# Patient Record
Sex: Female | Born: 1970
Health system: Southern US, Community
[De-identification: ages and names within clinical notes are randomized; demographics above are authoritative.]

## PROBLEM LIST (undated history)

## (undated) HISTORY — PX: NO PAST SURGERIES: SHX2092

---

## 2003-12-06 ENCOUNTER — Other Ambulatory Visit: Payer: Self-pay

## 2004-07-15 ENCOUNTER — Emergency Department: Payer: Self-pay | Admitting: Emergency Medicine

## 2004-07-17 ENCOUNTER — Ambulatory Visit: Payer: Self-pay | Admitting: Emergency Medicine

## 2005-02-25 ENCOUNTER — Observation Stay: Payer: Self-pay | Admitting: Unknown Physician Specialty

## 2005-03-07 ENCOUNTER — Inpatient Hospital Stay: Payer: Self-pay

## 2006-09-11 ENCOUNTER — Ambulatory Visit: Payer: Self-pay | Admitting: Internal Medicine

## 2007-08-13 ENCOUNTER — Ambulatory Visit: Payer: Self-pay | Admitting: Internal Medicine

## 2008-08-25 ENCOUNTER — Ambulatory Visit: Payer: Self-pay | Admitting: Internal Medicine

## 2017-10-16 DIAGNOSIS — L708 Other acne: Secondary | ICD-10-CM | POA: Diagnosis not present

## 2017-10-31 DIAGNOSIS — Z7689 Persons encountering health services in other specified circumstances: Secondary | ICD-10-CM | POA: Diagnosis not present

## 2017-11-05 DIAGNOSIS — Z7689 Persons encountering health services in other specified circumstances: Secondary | ICD-10-CM | POA: Diagnosis not present

## 2017-11-12 DIAGNOSIS — Z0000 Encounter for general adult medical examination without abnormal findings: Secondary | ICD-10-CM | POA: Diagnosis not present

## 2017-11-12 DIAGNOSIS — Z01419 Encounter for gynecological examination (general) (routine) without abnormal findings: Secondary | ICD-10-CM | POA: Diagnosis not present

## 2017-12-24 DIAGNOSIS — J019 Acute sinusitis, unspecified: Secondary | ICD-10-CM | POA: Diagnosis not present

## 2017-12-30 ENCOUNTER — Other Ambulatory Visit: Payer: Self-pay | Admitting: Internal Medicine

## 2017-12-30 DIAGNOSIS — Z1231 Encounter for screening mammogram for malignant neoplasm of breast: Secondary | ICD-10-CM

## 2018-01-14 ENCOUNTER — Ambulatory Visit
Admission: RE | Admit: 2018-01-14 | Discharge: 2018-01-14 | Disposition: A | Discharge status: Home / Self Care | Payer: 59 | Source: Ambulatory Visit | Attending: Internal Medicine | Admitting: Internal Medicine

## 2018-01-14 ENCOUNTER — Encounter: Payer: Self-pay | Admitting: Radiology

## 2018-01-14 DIAGNOSIS — Z1231 Encounter for screening mammogram for malignant neoplasm of breast: Secondary | ICD-10-CM | POA: Insufficient documentation

## 2018-01-20 ENCOUNTER — Inpatient Hospital Stay
Admission: RE | Admit: 2018-01-20 | Discharge: 2018-01-20 | Disposition: A | Discharge status: Home / Self Care | Payer: Self-pay | Source: Ambulatory Visit | Attending: *Deleted | Admitting: *Deleted

## 2018-01-20 ENCOUNTER — Other Ambulatory Visit: Payer: Self-pay | Admitting: *Deleted

## 2018-01-20 DIAGNOSIS — Z9289 Personal history of other medical treatment: Secondary | ICD-10-CM

## 2018-05-06 DIAGNOSIS — R946 Abnormal results of thyroid function studies: Secondary | ICD-10-CM | POA: Diagnosis not present

## 2018-05-06 DIAGNOSIS — R5383 Other fatigue: Secondary | ICD-10-CM | POA: Diagnosis not present

## 2018-05-06 DIAGNOSIS — E785 Hyperlipidemia, unspecified: Secondary | ICD-10-CM | POA: Diagnosis not present

## 2018-05-15 DIAGNOSIS — E785 Hyperlipidemia, unspecified: Secondary | ICD-10-CM | POA: Diagnosis not present

## 2018-06-11 DIAGNOSIS — L670 Trichorrhexis nodosa: Secondary | ICD-10-CM | POA: Diagnosis not present

## 2018-07-24 DIAGNOSIS — D649 Anemia, unspecified: Secondary | ICD-10-CM | POA: Diagnosis not present

## 2018-08-08 ENCOUNTER — Other Ambulatory Visit: Payer: Self-pay | Admitting: Internal Medicine

## 2018-08-12 ENCOUNTER — Other Ambulatory Visit: Payer: Self-pay | Admitting: Internal Medicine

## 2018-08-12 DIAGNOSIS — Z1231 Encounter for screening mammogram for malignant neoplasm of breast: Secondary | ICD-10-CM

## 2018-08-13 ENCOUNTER — Other Ambulatory Visit: Payer: Self-pay | Admitting: Internal Medicine

## 2018-08-13 DIAGNOSIS — N644 Mastodynia: Secondary | ICD-10-CM

## 2018-08-15 ENCOUNTER — Other Ambulatory Visit: Payer: Self-pay | Admitting: Internal Medicine

## 2018-08-15 DIAGNOSIS — N644 Mastodynia: Secondary | ICD-10-CM

## 2018-08-19 ENCOUNTER — Other Ambulatory Visit: Payer: 59

## 2018-09-10 ENCOUNTER — Inpatient Hospital Stay: Admission: RE | Admit: 2018-09-10 | Payer: 59 | Source: Ambulatory Visit

## 2018-09-10 ENCOUNTER — Other Ambulatory Visit: Payer: 59

## 2018-10-05 DIAGNOSIS — A084 Viral intestinal infection, unspecified: Secondary | ICD-10-CM | POA: Diagnosis not present

## 2018-11-05 ENCOUNTER — Ambulatory Visit
Admission: RE | Admit: 2018-11-05 | Discharge: 2018-11-05 | Disposition: A | Discharge status: Home / Self Care | Payer: 59 | Source: Ambulatory Visit | Attending: Internal Medicine | Admitting: Internal Medicine

## 2018-11-05 ENCOUNTER — Other Ambulatory Visit: Payer: Self-pay

## 2018-11-05 DIAGNOSIS — N644 Mastodynia: Secondary | ICD-10-CM

## 2018-11-06 ENCOUNTER — Other Ambulatory Visit: Payer: 59

## 2018-11-11 DIAGNOSIS — Z0000 Encounter for general adult medical examination without abnormal findings: Secondary | ICD-10-CM | POA: Diagnosis not present

## 2018-11-18 DIAGNOSIS — Z0000 Encounter for general adult medical examination without abnormal findings: Secondary | ICD-10-CM | POA: Diagnosis not present

## 2018-11-18 DIAGNOSIS — E785 Hyperlipidemia, unspecified: Secondary | ICD-10-CM | POA: Diagnosis not present

## 2019-01-14 IMAGING — MG MM DIGITAL SCREENING BILAT W/ CAD
4 series · 4 of 4 positions shown · non-contrast
Comparison: Previous exam(s).

CLINICAL DATA: Screening.

EXAM:
DIGITAL SCREENING BILATERAL MAMMOGRAM WITH CAD

[L CC]
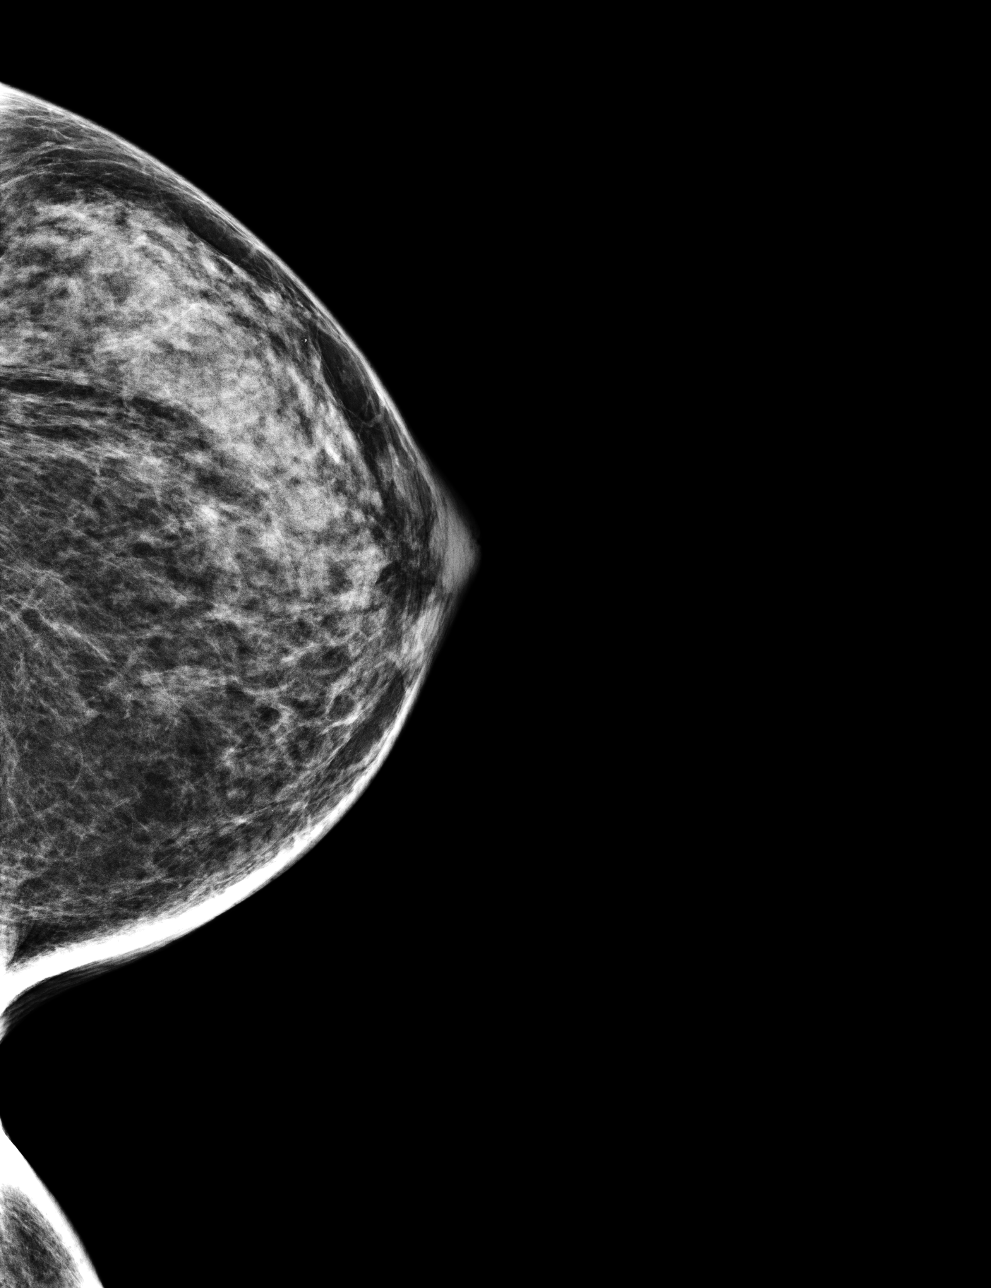

[R MLO]
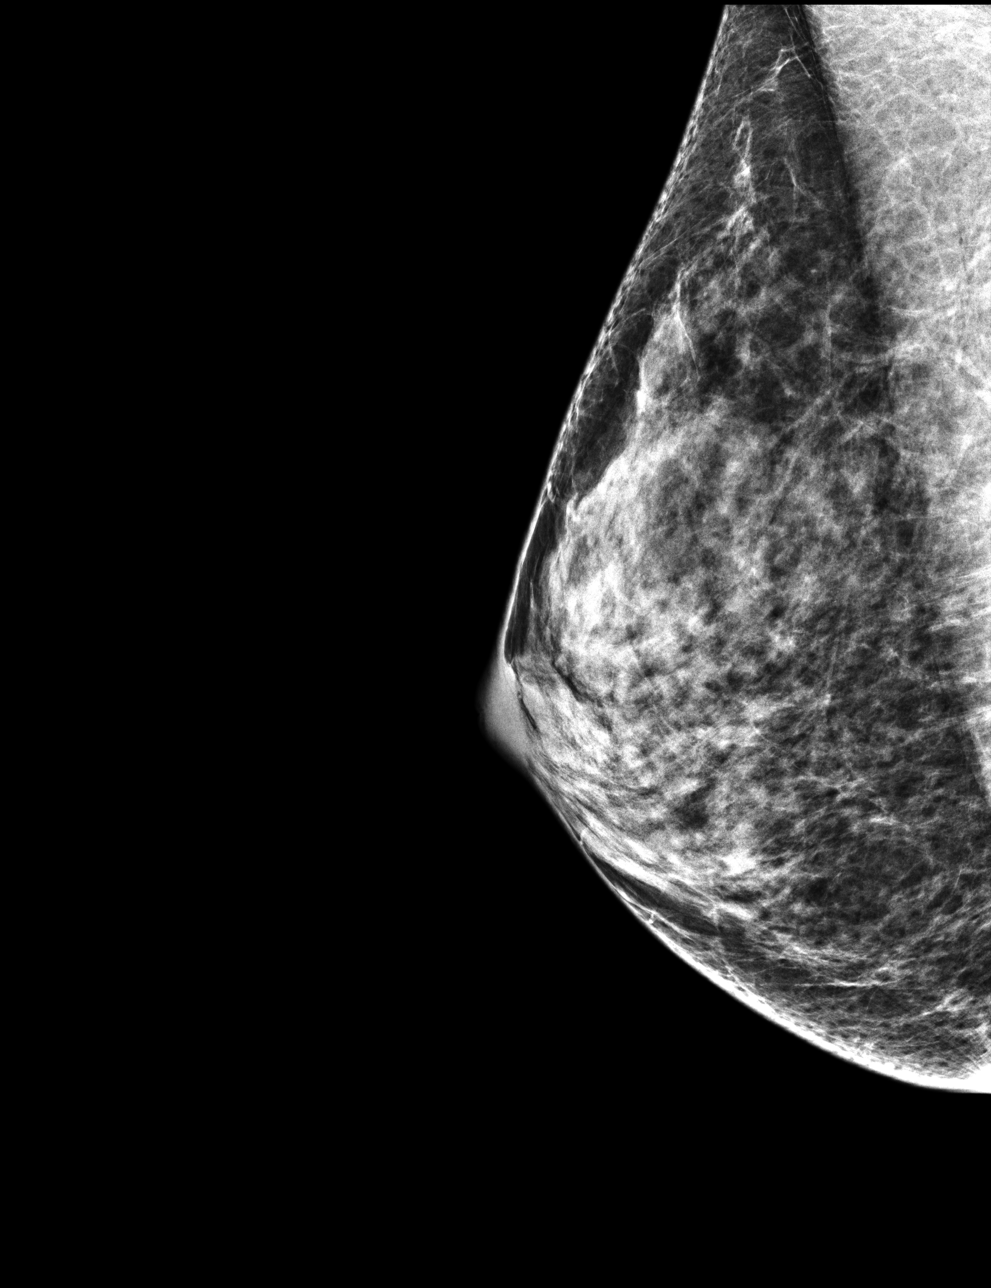

[L MLO]
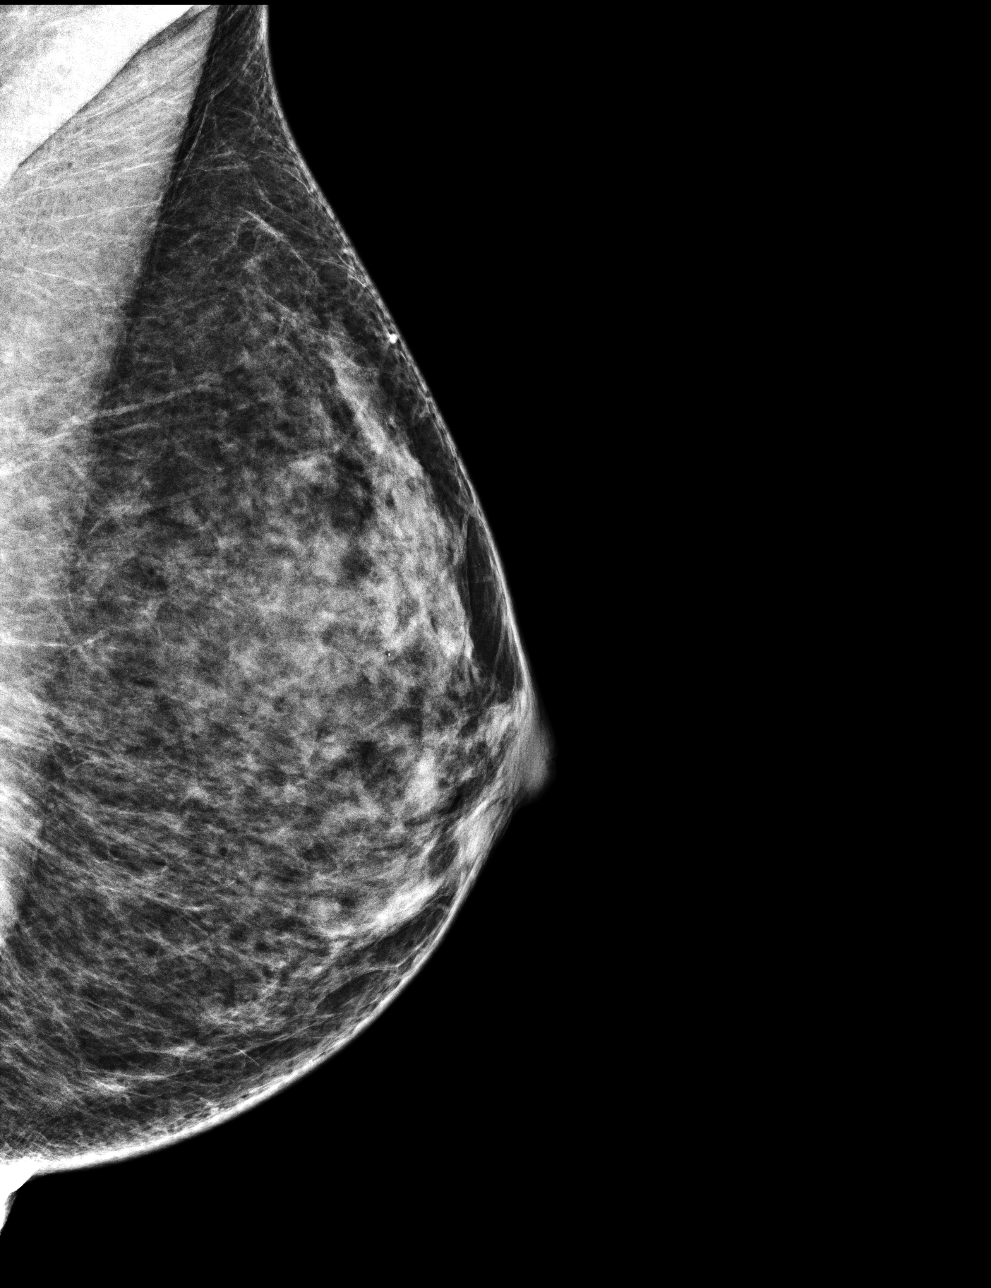

[R CC]
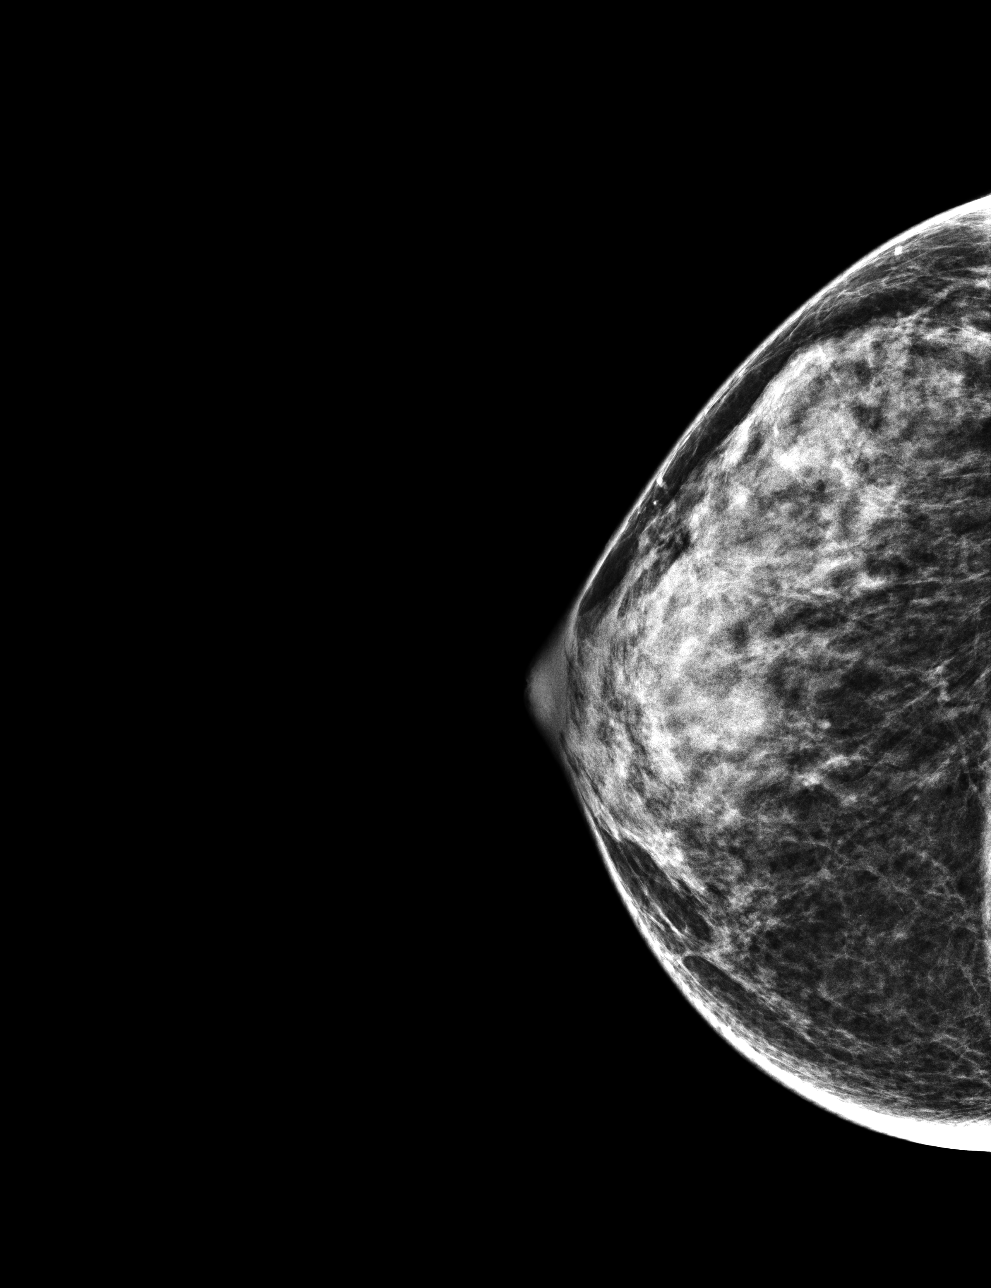

[4 of 4 positions shown; findings below may reference images not displayed]

ACR Breast Density Category c: The breast tissue is heterogeneously
dense, which may obscure small masses.
FINDINGS: There are no findings suspicious for malignancy. Images were
processed with CAD.
IMPRESSION: No mammographic evidence of malignancy. A result letter of this
screening mammogram will be mailed directly to the patient.

RECOMMENDATION:
Screening mammogram in one year. (Code:[id number])

BI-RADS CATEGORY  1: Negative.

## 2019-03-02 ENCOUNTER — Other Ambulatory Visit: Payer: Self-pay | Admitting: Internal Medicine

## 2019-03-02 DIAGNOSIS — Z1231 Encounter for screening mammogram for malignant neoplasm of breast: Secondary | ICD-10-CM

## 2019-03-17 ENCOUNTER — Ambulatory Visit
Admission: RE | Admit: 2019-03-17 | Discharge: 2019-03-17 | Disposition: A | Discharge status: Home / Self Care | Payer: 59 | Source: Ambulatory Visit | Attending: Internal Medicine | Admitting: Internal Medicine

## 2019-03-17 ENCOUNTER — Encounter (INDEPENDENT_AMBULATORY_CARE_PROVIDER_SITE_OTHER): Payer: Self-pay

## 2019-03-17 ENCOUNTER — Other Ambulatory Visit: Payer: Self-pay

## 2019-03-17 DIAGNOSIS — Z1231 Encounter for screening mammogram for malignant neoplasm of breast: Secondary | ICD-10-CM | POA: Diagnosis not present

## 2020-06-03 ENCOUNTER — Other Ambulatory Visit: Payer: Self-pay | Admitting: Internal Medicine

## 2020-06-03 DIAGNOSIS — Z1231 Encounter for screening mammogram for malignant neoplasm of breast: Secondary | ICD-10-CM

## 2020-09-30 ENCOUNTER — Other Ambulatory Visit: Payer: Self-pay

## 2020-09-30 ENCOUNTER — Ambulatory Visit
Admission: RE | Admit: 2020-09-30 | Discharge: 2020-09-30 | Disposition: A | Discharge status: Home / Self Care | Payer: BC Managed Care – PPO | Source: Ambulatory Visit | Attending: Internal Medicine | Admitting: Internal Medicine

## 2020-09-30 DIAGNOSIS — Z1231 Encounter for screening mammogram for malignant neoplasm of breast: Secondary | ICD-10-CM | POA: Diagnosis not present

## 2020-09-30 LAB — HM MAMMOGRAPHY

## 2021-05-03 DIAGNOSIS — Z0000 Encounter for general adult medical examination without abnormal findings: Secondary | ICD-10-CM | POA: Diagnosis not present

## 2021-05-03 DIAGNOSIS — E785 Hyperlipidemia, unspecified: Secondary | ICD-10-CM | POA: Diagnosis not present

## 2021-05-04 DIAGNOSIS — M5489 Other dorsalgia: Secondary | ICD-10-CM | POA: Diagnosis not present

## 2021-05-18 DIAGNOSIS — Z0000 Encounter for general adult medical examination without abnormal findings: Secondary | ICD-10-CM | POA: Diagnosis not present

## 2021-05-18 DIAGNOSIS — F419 Anxiety disorder, unspecified: Secondary | ICD-10-CM | POA: Diagnosis not present

## 2021-05-18 DIAGNOSIS — M7918 Myalgia, other site: Secondary | ICD-10-CM | POA: Diagnosis not present

## 2021-06-04 DIAGNOSIS — Z1211 Encounter for screening for malignant neoplasm of colon: Secondary | ICD-10-CM | POA: Diagnosis not present

## 2021-06-04 DIAGNOSIS — Z1212 Encounter for screening for malignant neoplasm of rectum: Secondary | ICD-10-CM | POA: Diagnosis not present

## 2021-06-04 LAB — COLOGUARD: Cologuard: NEGATIVE

## 2021-06-17 LAB — COLOGUARD: COLOGUARD: NEGATIVE

## 2021-07-11 ENCOUNTER — Ambulatory Visit: Payer: Self-pay | Admitting: Internal Medicine

## 2021-10-05 ENCOUNTER — Encounter: Payer: Self-pay | Admitting: Internal Medicine

## 2021-10-05 ENCOUNTER — Ambulatory Visit (INDEPENDENT_AMBULATORY_CARE_PROVIDER_SITE_OTHER): Payer: BC Managed Care – PPO | Admitting: Internal Medicine

## 2021-10-05 ENCOUNTER — Other Ambulatory Visit: Payer: Self-pay

## 2021-10-05 DIAGNOSIS — M542 Cervicalgia: Secondary | ICD-10-CM | POA: Insufficient documentation

## 2021-10-05 DIAGNOSIS — F411 Generalized anxiety disorder: Secondary | ICD-10-CM

## 2021-10-05 DIAGNOSIS — F419 Anxiety disorder, unspecified: Secondary | ICD-10-CM | POA: Insufficient documentation

## 2021-10-05 NOTE — Patient Instructions (Signed)

## 2021-10-05 NOTE — Assessment & Plan Note (Signed)
Stable on Paroxetine ?Support offered ?Will monitor ?

## 2021-10-05 NOTE — Assessment & Plan Note (Signed)
Intermittent ?Continue Meloxicam as needed ?

## 2021-10-05 NOTE — Progress Notes (Signed)
HPI ? ?Patient presents to clinic today to establish care and for management of the conditions listed below. ? ?Anxiety: Chronic, managed on Paroxetine.  She is not currently seeing a therapist.  She denies depression, SI/HI. ? ?Intermittent Neck Pain: managed with Meloxicam as needed. ? ?No past medical history on file. ? ?Current Outpatient Medications  ?Medication Sig Dispense Refill  ? fluticasone (FLONASE) 50 MCG/ACT nasal spray 2 sprays in each nostril    ? meloxicam (MOBIC) 15 MG tablet Take by mouth.    ? PARoxetine (PAXIL) 20 MG tablet Take 20 mg by mouth every morning.    ? ?No current facility-administered medications for this visit.  ? ? ?No Known Allergies ? ?Family History  ?Problem Relation Age of Onset  ? Lung cancer Mother   ? Diabetes Father   ? Hypertension Father   ? Healthy Sister   ? Breast cancer Maternal Grandmother 12  ? Congestive Heart Failure Paternal Grandmother   ? Colon cancer Neg Hx   ? ? ?Social History  ? ?Socioeconomic History  ? Marital status: Single  ?  Spouse name: Not on file  ? Number of children: Not on file  ? Years of education: Not on file  ? Highest education level: Not on file  ?Occupational History  ? Not on file  ?Tobacco Use  ? Smoking status: Never  ? Smokeless tobacco: Never  ?Vaping Use  ? Vaping Use: Never used  ?Substance and Sexual Activity  ? Alcohol use: Not on file  ? Drug use: Not on file  ? Sexual activity: Not on file  ?Other Topics Concern  ? Not on file  ?Social History Narrative  ? Not on file  ? ?Social Determinants of Health  ? ?Financial Resource Strain: Not on file  ?Food Insecurity: Not on file  ?Transportation Needs: Not on file  ?Physical Activity: Not on file  ?Stress: Not on file  ?Social Connections: Not on file  ?Intimate Partner Violence: Not on file  ? ? ?ROS: ? ?Constitutional: Denies fever, malaise, fatigue, headache or abrupt weight changes.  ?HEENT: Denies eye pain, eye redness, ear pain, ringing in the ears, wax buildup, runny nose,  nasal congestion, bloody nose, or sore throat. ?Respiratory: Denies difficulty breathing, shortness of breath, cough or sputum production.   ?Cardiovascular: Denies chest pain, chest tightness, palpitations or swelling in the hands or feet.  ?Gastrointestinal: Denies abdominal pain, bloating, constipation, diarrhea or blood in the stool.  ?GU: Denies frequency, urgency, pain with urination, blood in urine, odor or discharge. ?Musculoskeletal: Patient reports intermittent neck pain.  Denies decrease in range of motion, difficulty with gait, or joint swelling.  ?Skin: Denies redness, rashes, lesions or ulcercations.  ?Neurological: Denies dizziness, difficulty with memory, difficulty with speech or problems with balance and coordination.  ?Psych: Patient has a history of anxiety.  Denies depression, SI/HI. ? ?No other specific complaints in a complete review of systems (except as listed in HPI above). ? ?PE: ? ?BP 136/60 (BP Location: Right Arm, Patient Position: Sitting, Cuff Size: Normal)   Pulse (!) 55   Temp (!) 97.3 ?F (36.3 ?C) (Temporal)   Ht 5' 5.5" (1.664 m)   Wt 138 lb (62.6 kg)   SpO2 100%   BMI 22.62 kg/m?  ?Wt Readings from Last 3 Encounters:  ?10/05/21 138 lb (62.6 kg)  ? ? ?General: Appears her stated age, well developed, well nourished in NAD. ?HEENT: Head: normal shape and size; Eyes: sclera white, PERRLA and EOMs intact;  ?  Cardiovascular: Bradycardic with normal rhythm. S1,S2 noted.  No murmur, rubs or gallops noted. No JVD or BLE edema.  ?Pulmonary/Chest: Normal effort and positive vesicular breath sounds. No respiratory distress. No wheezes, rales or ronchi noted.  ?Musculoskeletal: No difficulty with gait.  ?Neurological: Alert and oriented. ?Psychiatric: Mood and affect normal. Behavior is normal. Judgment and thought content normal.  ? ? ? ?Assessment and Plan: ? ?Webb Silversmith, NP ?This visit occurred during the SARS-CoV-2 public health emergency.  Safety protocols were in place, including  screening questions prior to the visit, additional usage of staff PPE, and extensive cleaning of exam room while observing appropriate contact time as indicated for disinfecting solutions.  ? ?

## 2021-11-14 ENCOUNTER — Other Ambulatory Visit: Payer: Self-pay | Admitting: Internal Medicine

## 2021-11-14 DIAGNOSIS — Z1231 Encounter for screening mammogram for malignant neoplasm of breast: Secondary | ICD-10-CM

## 2021-12-18 ENCOUNTER — Ambulatory Visit
Admission: RE | Admit: 2021-12-18 | Discharge: 2021-12-18 | Disposition: A | Discharge status: Home / Self Care | Payer: BC Managed Care – PPO | Source: Ambulatory Visit | Attending: Internal Medicine | Admitting: Internal Medicine

## 2021-12-18 ENCOUNTER — Encounter: Payer: Self-pay | Admitting: Radiology

## 2021-12-18 DIAGNOSIS — Z1231 Encounter for screening mammogram for malignant neoplasm of breast: Secondary | ICD-10-CM | POA: Insufficient documentation

## 2022-04-13 ENCOUNTER — Ambulatory Visit (INDEPENDENT_AMBULATORY_CARE_PROVIDER_SITE_OTHER): Payer: BC Managed Care – PPO | Admitting: Internal Medicine

## 2022-04-13 ENCOUNTER — Encounter: Payer: Self-pay | Admitting: Internal Medicine

## 2022-04-13 VITALS — BP 112/72 | HR 68 | Temp 97.3°F | Ht 66.0 in | Wt 134.0 lb

## 2022-04-13 DIAGNOSIS — G8929 Other chronic pain: Secondary | ICD-10-CM | POA: Insufficient documentation

## 2022-04-13 DIAGNOSIS — Z1159 Encounter for screening for other viral diseases: Secondary | ICD-10-CM | POA: Diagnosis not present

## 2022-04-13 DIAGNOSIS — Z0001 Encounter for general adult medical examination with abnormal findings: Secondary | ICD-10-CM | POA: Diagnosis not present

## 2022-04-13 DIAGNOSIS — Z114 Encounter for screening for human immunodeficiency virus [HIV]: Secondary | ICD-10-CM | POA: Diagnosis not present

## 2022-04-13 MED ORDER — PAROXETINE HCL 20 MG PO TABS: 20.0000 mg | ORAL_TABLET | Freq: Every morning | ORAL | 3 refills | Status: DC

## 2022-04-13 MED ORDER — MELOXICAM 15 MG PO TABS: 15.0000 mg | ORAL_TABLET | Freq: Every day | ORAL | 3 refills | Status: AC

## 2022-04-13 NOTE — Patient Instructions (Signed)

## 2022-04-13 NOTE — Progress Notes (Signed)
Subjective:    Patient ID: Donna Rocha, female    DOB: [birth year], 51 y.o.   MRN: [id number]  HPI  Patient presents to clinic today for her annual exam.  Flu: never Tetanus: unsure, at [employer] COVID: never Shingrix: never Pap smear: < 5 years ago, [facility] M[facility]ogram: 11/2021 Colon screening: Cologuard Vision screening: annually Dentist: biannually  Diet: She does eat meat. She consumes fruits and veggies. She does eat some fried foods. She drinks mostly juice, sweet tea. Exercise: None  Review of Systems     No past medical history on file.  Current Outpatient Medications  Medication Sig Dispense Refill   fluticasone (FLONASE) 50 MCG/ACT nasal spray 2 sprays in each nostril     meloxicam (MOBIC) 15 MG tablet Take by mouth.     PARoxetine (PAXIL) 20 MG tablet Take 20 mg by mouth every morning.     No current facility-administered medications for this visit.    No Known Allergies  Family History  Problem Relation Age of Onset   Lung cancer Mother    Diabetes Father    Hypertension Father    Healthy Sister    Breast cancer Maternal Grandmother 76   Congestive Heart Failure Paternal Grandmother    Colon cancer Neg Hx     Social History   Socioeconomic History   Marital status: Single    Spouse name: Not on file   Number of children: Not on file   Years of education: Not on file   Highest education level: Not on file  Occupational History   Not on file  Tobacco Use   Smoking status: Never   Smokeless tobacco: Never  Vaping Use   Vaping Use: Never used  Substance and Sexual Activity   Alcohol use: Not on file   Drug use: Not on file   Sexual activity: Not on file  Other Topics Concern   Not on file  Social History Narrative   Not on file   Social Determinants of Health   Financial Resource Strain: Not on file  Food Insecurity: Not on file  Transportation Needs: Not on file  Physical Activity: Not on file  Stress: Not on  file  Social Connections: Not on file  Intimate Partner Violence: Not on file     Constitutional: Denies fever, malaise, fatigue, headache or abrupt weight changes.  HEENT: Denies eye pain, eye redness, ear pain, ringing in the ears, wax buildup, runny nose, nasal congestion, bloody nose, or sore throat. Respiratory: Denies difficulty breathing, shortness of breath, cough or sputum production.   Cardiovascular: Denies chest pain, chest tightness, palpitations or swelling in the hands or feet.  Gastrointestinal: Denies abdominal pain, bloating, constipation, diarrhea or blood in the stool.  GU: Pt reports irregular periods. Denies urgency, frequency, pain with urination, burning sensation, blood in urine, odor or discharge. Musculoskeletal: Pt reports intermittent left shoulder pain. Denies decrease in range of motion, difficulty with gait, muscle pain or joint pain and swelling.  Skin: Denies redness, rashes, lesions or ulcercations.  Neurological: Denies dizziness, difficulty with memory, difficulty with speech or problems with balance and coordination.  Psych: Patient has a history of anxiety.  Denies depression, SI/HI.  No other specific complaints in a complete review of systems (except as listed in HPI above).  Objective:   Physical Exam BP 112/72 (BP Location: Left Arm, Patient Position: Sitting, Cuff Size: Normal)   Pulse 68   Temp (!) 97.3 F (36.3 C) (Temporal)  Ht '5\' 6"'  (1.676 m)   Wt 134 lb (60.8 kg)   SpO2 99%   BMI 21.63 kg/m   Wt Readings from Last 3 Encounters:  10/05/21 138 lb (62.6 kg)    General: Appears her stated age, well developed, well nourished in NAD. Skin: Warm, dry and intact.  HEENT: Head: normal shape and size; Eyes: sclera white, no icterus, conjunctiva pink, PERRLA and EOMs intact;  Neck:  Neck supple, trachea midline. No masses, lumps or thyromegaly present.  Cardiovascular: Normal rate and rhythm. S1,S2 noted.  No murmur, rubs or gallops noted.  No JVD or BLE edema. No carotid bruits noted. Pulmonary/Chest: Normal effort and positive vesicular breath sounds. No respiratory distress. No wheezes, rales or ronchi noted.  Abdomen: Normal bowel sounds Musculoskeletal: Strength 5/5 BUE/BLE. No difficulty with gait.  Neurological: Alert and oriented. Cranial nerves II-XII grossly intact. Coordination normal.  Psychiatric: Mood and affect normal. Behavior is normal. Judgment and thought content normal.       Assessment & Plan:   Preventative Health Maintenance:  She declines flu shot today Tetanus UTD per her report Encouraged her to get her COVID-vaccine Discussed Shingrix vaccine, she will check coverage with her insurance company and schedule a nurse visit if she would like to have this done Pap smear UTD per her report, will request copy Mammogram UTD Cologuard UTD per her report, will request copy Encouraged her to consume a balanced diet and exercise regimen Advised her to see an eye doctor and dentist annually We will check CBC, c-Met, lipid, A1c, HIV and hep C today  RTC in 1 year, sooner if needed Webb Silversmith, NP

## 2022-04-17 LAB — COMPLETE METABOLIC PANEL WITH GFR
AG Ratio: 1.7 (calc) (ref 1.0–2.5)
ALT: 10 U/L (ref 6–29)
AST: 13 U/L (ref 10–35)
Albumin: 4.5 g/dL (ref 3.6–5.1)
Alkaline phosphatase (APISO): 40 U/L (ref 37–153)
BUN: 18 mg/dL (ref 7–25)
CO2: 25 mmol/L (ref 20–32)
Calcium: 9.9 mg/dL (ref 8.6–10.4)
Chloride: 106 mmol/L (ref 98–110)
Creat: 0.76 mg/dL (ref 0.50–1.03)
Globulin: 2.6 g/dL (calc) (ref 1.9–3.7)
Glucose, Bld: 63 mg/dL — ABNORMAL LOW (ref 65–99)
Potassium: 3.8 mmol/L (ref 3.5–5.3)
Sodium: 140 mmol/L (ref 135–146)
Total Bilirubin: 0.8 mg/dL (ref 0.2–1.2)
Total Protein: 7.1 g/dL (ref 6.1–8.1)
eGFR: 95 mL/min/{1.73_m2} (ref >=60)

## 2022-04-17 LAB — CBC
HCT: 39.7 % (ref 35.0–45.0)
Hemoglobin: 13.2 g/dL (ref 11.7–15.5)
MCH: 32.7 pg (ref 27.0–33.0)
MCHC: 33.2 g/dL (ref 32.0–36.0)
MCV: 98.3 fL (ref 80.0–100.0)
MPV: 11.6 fL (ref 7.5–12.5)
Platelets: 226 10*3/uL (ref 140–400)
RBC: 4.04 10*6/uL (ref 3.80–5.10)
RDW: 12.3 % (ref 11.0–15.0)
WBC: 5 10*3/uL (ref 3.8–10.8)

## 2022-04-17 LAB — HEPATITIS C ANTIBODY: Hepatitis C Ab: NONREACTIVE

## 2022-04-17 LAB — LIPID PANEL
Cholesterol: 210 mg/dL — ABNORMAL HIGH (ref <200)
HDL: 66 mg/dL (ref >=50)
LDL Cholesterol (Calc): 120 mg/dL (calc) — ABNORMAL HIGH
Non-HDL Cholesterol (Calc): 144 mg/dL (calc) — ABNORMAL HIGH (ref <130)
Total CHOL/HDL Ratio: 3.2 (calc) (ref <5.0)
Triglycerides: 126 mg/dL (ref <150)

## 2022-04-17 LAB — HIV ANTIBODY (ROUTINE TESTING W REFLEX): HIV 1&2 Ab, 4th Generation: NONREACTIVE

## 2022-07-02 ENCOUNTER — Encounter: Payer: Self-pay | Admitting: Family Medicine

## 2022-07-02 ENCOUNTER — Ambulatory Visit: Payer: BC Managed Care – PPO | Admitting: Family Medicine

## 2022-07-02 VITALS — BP 123/65 | HR 63 | Ht 66.0 in | Wt 132.0 lb

## 2022-07-02 DIAGNOSIS — J069 Acute upper respiratory infection, unspecified: Secondary | ICD-10-CM

## 2022-07-02 DIAGNOSIS — R059 Cough, unspecified: Secondary | ICD-10-CM | POA: Diagnosis not present

## 2022-07-02 LAB — POC COVID19 BINAXNOW: SARS Coronavirus 2 Ag: NEGATIVE

## 2022-07-02 LAB — POCT INFLUENZA A/B
Influenza A, POC: NEGATIVE
Influenza B, POC: NEGATIVE

## 2022-07-02 MED ORDER — BENZONATATE 100 MG PO CAPS: 100.0000 mg | ORAL_CAPSULE | Freq: Three times a day (TID) | ORAL | 0 refills | Status: DC | PRN

## 2022-07-02 NOTE — Patient Instructions (Addendum)
Thank you for coming to the office today.  1. It sounds like you have a Upper Respiratory Virus - this will most likely run it's course in 7 to 10 days. Recommend good hand washing.  FLU and COVID testing were NEGATIVE.  - Drink plenty of fluids to improve congestion - You may try over the counter Nasal Saline spray (Simply Saline, Ocean Spray) as needed to reduce congestion. - Drink warm herbal tea with honey for sore throat - Start taking Tylenol extra strength 1 to 2 tablets every 6-8 hours for aches or fever/chills for next few days as needed.  Start nasal steroid Flonase 2 sprays in each nostril daily for 4-6 weeks, may repeat course seasonally or as needed  Start Tessalon Perls take 1 capsule up to 3 times a day as needed for cough  Use the OTC cough medicine you have Cold & Flu  If not improving let us know by end of week  If symptoms significantly worsening with persistent fevers/chills despite tylenol/ibpurofen, nausea, vomiting unable to tolerate food/fluids or medicine, body aches, or shortness of breath, sinus pain pressure or worsening productive cough, then follow-up for re-evaluation, may seek more immediate care at Urgent Care or ED if more concerned for emergency.    Please schedule a Follow-up Appointment to: Return in about 3 days (around 07/05/2022), or if symptoms worsen or fail to improve, for if not improved.  If you have any other questions or concerns, please feel free to call the office or send a message through MyChart. You may also schedule an earlier appointment if necessary.  Additionally, you may be receiving a survey about your experience at our office within a few days to 1 week by e-mail or mail. We value your feedback.  Saralyn Pilar, DO [facility], [place]

## 2022-07-02 NOTE — Progress Notes (Signed)
Subjective:    Patient ID: Donna Rocha, female    DOB: 03-09-1971, 51 y.o.   MRN: [id number]  Donna Rocha is a 51 y.o. female presenting on 07/02/2022 for Generalized Body Aches, Cough, Headache, and Sore Throat  Patient presents for a same day appointment.  PCP Nicki Reaper, FNP  HPI  Viral Syndrome Reports onset Saturday 2 days ago with cough and congestion then woke up Sunday with body aches and felt chills and feverish but did not document. - Tried OTC Cold & Flu as needed - She has Meloxicam 15mg AS NEEDED not on regularly. - Has Flonase but not using currently Out of work today needs note     12 /10/2021    2:06 PM 10/05/2021    3:08 PM 10/05/2021    2:19 PM  Depression screen PHQ 2/9  Decreased Interest 0 0 0  Down, Depressed, Hopeless 0 0 0  PHQ - 2 Score 0 0 0  Altered sleeping 0 0   Tired, decreased energy 0 0   Change in appetite 0 0   Feeling bad or failure about yourself  0 0   Trouble concentrating 0 0   Moving slowly or fidgety/restless 0 0   Suicidal thoughts 0 0   PHQ-9 Score 0 0   Difficult doing work/chores Not difficult at all Not difficult at all     Social History   Tobacco Use   Smoking status: Never   Smokeless tobacco: Never  Vaping Use   Vaping Use: Never used    Review of Systems Per HPI unless specifically indicated above     Objective:    BP 123/65   Pulse 63   Ht 5\' 6"  (1.676 m)   Wt 132 lb (59.9 kg)   SpO2 98%   BMI 21.31 kg/m   Wt Readings from Last 3 Encounters:  07/02/22 132 lb (59.9 kg)  04/13/22 134 lb (60.8 kg)  10/05/21 138 lb (62.6 kg)    Physical Exam Vitals and nursing note reviewed.  Constitutional:      General: She is not in acute distress.    Appearance: She is well-developed. She is not diaphoretic.     Comments: Well-appearing, comfortable, cooperative  HENT:     Head: Normocephalic and atraumatic.  Eyes:     General:        Right eye: No discharge.        Left eye: No discharge.      Conjunctiva/sclera: Conjunctivae normal.  Neck:     Thyroid: No thyromegaly.  Cardiovascular:     Rate and Rhythm: Normal rate and regular rhythm.     Heart sounds: Normal heart sounds. No murmur heard. Pulmonary:     Effort: Pulmonary effort is normal. No respiratory distress.     Breath sounds: Normal breath sounds. No wheezing or rales.  Musculoskeletal:        General: Normal range of motion.     Cervical back: Normal range of motion and neck supple.  Lymphadenopathy:     Cervical: No cervical adenopathy.  Skin:    General: Skin is warm and dry.     Findings: No erythema or rash.  Neurological:     Mental Status: She is alert and oriented to person, place, and time.  Psychiatric:        Behavior: Behavior normal.     Comments: Well groomed, good eye contact, normal speech and thoughts    Results for orders placed or performed  in visit on 07/02/22  POC COVID-19  Result Value Ref Range   SARS Coronavirus 2 Ag Negative Negative  POCT Influenza A/B  Result Value Ref Range   Influenza A, POC Negative Negative   Influenza B, POC Negative Negative      Assessment & Plan:   Problem List Items Addressed This Visit   None Visit Diagnoses     Viral URI with cough    -  Primary   Relevant Medications   benzonatate (TESSALON) 100 MG capsule   Other Relevant Orders   POC COVID-19 (Completed)   POCT Influenza A/B (Completed)       Consistent with viral URI x 3 days Negative COVID FLU Rapid tests today Afebrile, well hydrated on exam, no focal signs of infection (ears, throat, lungs clear).  Plan: 1. Reassurance, likely self-limited  - Start nasal steroid Flonase 2 sprays in each nostril daily for 4-6 weeks, may repeat course seasonally or as needed - Start [employer] take 1 capsule up to 3 times a day as needed for cough OTC cold and flu med Improve hydration Tylenol / Motrin PRN fevers Return criteria given  Work note  Defer antibiotics today if not improved  5-7 days consider again  Meds ordered this encounter  Medications   benzonatate (TESSALON) 100 MG capsule    Sig: Take 1 capsule (100 mg total) by mouth 3 (three) times daily as needed for cough.    Dispense:  30 capsule    Refill:  0      Follow up plan: Return in about 3 days (around 07/05/2022), or if symptoms worsen or fail to improve, for if not improved.   Saralyn Pilar, DO [facility] 07/02/2022, 2:24 PM

## 2022-07-03 ENCOUNTER — Ambulatory Visit: Payer: Self-pay

## 2022-07-03 NOTE — Telephone Encounter (Signed)
Pt inquiring if she can take benzonatate (TESSALON) 100 MG capsule w/ cold and flu medicine   Left message to call back.

## 2022-07-03 NOTE — Telephone Encounter (Signed)
Summary: discuss medication   Pt inquiring if she can take benzonatate (TESSALON) 100 MG capsule w/ cold and flu medicine  Please advise    Called pt - [id number]

## 2022-07-04 NOTE — Telephone Encounter (Signed)
Yes, she can take Tessalon with cold and flu medicine.

## 2022-07-04 NOTE — Telephone Encounter (Signed)
LMTCB 07/04/2022.  PEC please advise pt when she calls back.   Thanks,   -Vernona Rieger

## 2022-08-14 ENCOUNTER — Encounter: Payer: Self-pay | Admitting: Internal Medicine

## 2022-08-14 ENCOUNTER — Ambulatory Visit: Payer: BC Managed Care – PPO | Admitting: Internal Medicine

## 2022-08-14 VITALS — BP 112/64 | HR 63 | Temp 97.1°F | Wt 133.0 lb

## 2022-08-14 DIAGNOSIS — J0100 Acute maxillary sinusitis, unspecified: Secondary | ICD-10-CM | POA: Diagnosis not present

## 2022-08-14 MED ORDER — FLUTICASONE PROPIONATE 50 MCG/ACT NA SUSP: NASAL | 5 refills | Status: DC

## 2022-08-14 MED ORDER — PREDNISONE 10 MG PO TABS: ORAL_TABLET | ORAL | 0 refills | Status: DC

## 2022-08-14 MED ORDER — AZITHROMYCIN 250 MG PO TABS: ORAL_TABLET | ORAL | 0 refills | Status: DC

## 2022-08-14 NOTE — Progress Notes (Signed)
HPI  Pt presents to the clinic today with c/o facial pressure, nasal congestion, sore throat and cough. This started 6 weeks ago but worsened yesterday. She is blowing tinged clear mucous out of her nose. She denies difficulty swallowing. The cough is productive at times.  She denies headache, runny nose, ear pain, shortness of breath, nausea, vomiting or diarrhea.  She denies fever, chills or body aches.  She has tried Sudafed and Alka-Seltzer plus OTC with minimal relief of symptoms.  She has not had sick contacts that she is aware of.  She was treated for a viral URI 6 weeks ago with Tessalon.  Review of Systems     No past medical history on file.  Family History  Problem Relation Age of Onset   Lung cancer Mother    Diabetes Father    Hypertension Father    Healthy Sister    Breast cancer Maternal Grandmother 76   Congestive Heart Failure Paternal Grandmother    Colon cancer Neg Hx     Social History   Socioeconomic History   Marital status: Single    Spouse name: Not on file   Number of children: Not on file   Years of education: Not on file   Highest education level: Not on file  Occupational History   Not on file  Tobacco Use   Smoking status: Never   Smokeless tobacco: Never  Vaping Use   Vaping Use: Never used  Substance and Sexual Activity   Alcohol use: Not on file   Drug use: Not on file   Sexual activity: Not on file  Other Topics Concern   Not on file  Social History Narrative   Not on file   Social Determinants of Health   Financial Resource Strain: Not on file  Food Insecurity: Not on file  Transportation Needs: Not on file  Physical Activity: Not on file  Stress: Not on file  Social Connections: Not on file  Intimate Partner Violence: Not on file    No Known Allergies   Constitutional:  Denies headache, fatigue, fever or abrupt weight changes.  HEENT:  Positive facial pressure, nasal congestion and sore throat. Denies eye redness, eye pain,  pressure behind the eyes, facial pain,  ear pain, ringing in the ears, wax buildup, runny nose or bloody nose. Respiratory: Positive cough. Denies difficulty breathing or shortness of breath.  Cardiovascular: Denies chest pain, chest tightness, palpitations or swelling in the hands or feet.   No other specific complaints in a complete review of systems (except as listed in HPI above).  Objective:   BP 112/64 (BP Location: Left Arm, Patient Position: Sitting, Cuff Size: Normal)   Pulse 63   Temp (!) 97.1 F (36.2 C) (Temporal)   Wt 133 lb (60.3 kg)   SpO2 99%   BMI 21.47 kg/m   Wt Readings from Last 3 Encounters:  07/02/22 132 lb (59.9 kg)  04/13/22 134 lb (60.8 kg)  10/05/21 138 lb (62.6 kg)     General: Appears her stated age, well developed, well nourished in NAD. HEENT: Head: normal shape and size, maxillary sinus tenderness noted; Eyes: sclera white, no icterus, conjunctiva pink;  Nose: mucosa boggy and moist, septum midline; Throat/Mouth: + PND. Teeth present, mucosa erythematous and moist, no exudate noted, no lesions or ulcerations noted.  Neck: No cervical lymphadenopathy.  Cardiovascular: Normal rate and rhythm. S1,S2 noted.  No murmur, rubs or gallops noted.  Pulmonary/Chest: Normal effort and positive vesicular breath sounds. No  respiratory distress. No wheezes, rales or ronchi noted.       Assessment & Plan:   Acute Maxillary Sinusitis:  Get some rest and drink plenty of water Do salt water gargles for the sore throat Rx for Azithromax x 5 days Rx for Pred taper x 6 days  RTC in 8 months for your annual exam   Webb Silversmith, NP

## 2022-08-14 NOTE — Patient Instructions (Signed)

## 2022-12-18 ENCOUNTER — Encounter: Payer: Self-pay | Admitting: Internal Medicine

## 2022-12-18 ENCOUNTER — Other Ambulatory Visit: Payer: Self-pay | Admitting: Internal Medicine

## 2022-12-18 DIAGNOSIS — Z1231 Encounter for screening mammogram for malignant neoplasm of breast: Secondary | ICD-10-CM

## 2023-01-04 ENCOUNTER — Ambulatory Visit
Admission: RE | Admit: 2023-01-04 | Discharge: 2023-01-04 | Disposition: A | Discharge status: Home / Self Care | Payer: BC Managed Care – PPO | Source: Ambulatory Visit | Attending: Internal Medicine | Admitting: Internal Medicine

## 2023-01-04 DIAGNOSIS — Z1231 Encounter for screening mammogram for malignant neoplasm of breast: Secondary | ICD-10-CM | POA: Insufficient documentation

## 2023-02-16 ENCOUNTER — Other Ambulatory Visit: Payer: Self-pay | Admitting: Internal Medicine

## 2023-02-18 NOTE — Telephone Encounter (Signed)
Requested Prescriptions  Pending Prescriptions Disp Refills   fluticasone (FLONASE) 50 MCG/ACT nasal spray [Pharmacy Med Name: FLUTICASONE PROP 50 MCG SPRAY] 48 mL 1    Sig: 2 SPRAYS IN EACH NOSTRIL     Ear, Nose, and Throat: Nasal Preparations - Corticosteroids Passed - 02/16/2023  9:35 AM      Passed - Valid encounter within last 12 months    Recent Outpatient Visits           6 months ago Acute non-recurrent maxillary sinusitis   [facility] [place], Salvadore [place], NP   7 months ago Viral URI with cough   [facility] [place], Netta Neat, DO   10 months ago Encounter for general adult medical examination with abnormal findings   [facility] [place], Salvadore Oxford, NP   1 year ago Generalized anxiety disorder    [facility] [place], Salvadore Oxford, [place]

## 2023-05-05 ENCOUNTER — Other Ambulatory Visit: Payer: Self-pay | Admitting: Internal Medicine

## 2023-05-06 NOTE — Telephone Encounter (Signed)
Attempted to call patient- no answer- call can not be completed message Courtesy 30 day Rx given Requested Prescriptions  Pending Prescriptions Disp Refills   PARoxetine (PAXIL) 20 MG tablet [Pharmacy Med Name: PAROXETINE HCL 20 MG TABLET] 90 tablet 1    Sig: TAKE 1 TABLET BY MOUTH EVERY DAY IN THE MORNING     Psychiatry:  Antidepressants - SSRI Failed - 05/05/2023 10:27 AM      Failed - Valid encounter within last 6 months    Recent Outpatient Visits           8 months ago Acute non-recurrent maxillary sinusitis   [facility] [place], Salvadore Oxford, NP   10 months ago Viral URI with cough   [facility] Smitty Cords, DO   1 year ago Encounter for general adult medical examination with abnormal findings   [facility] [place], Salvadore Oxford, NP   1 year ago Generalized anxiety disorder   [facility] [place], Salvadore Oxford, [place]

## 2023-06-03 ENCOUNTER — Other Ambulatory Visit: Payer: Self-pay | Admitting: Internal Medicine

## 2023-06-04 NOTE — Telephone Encounter (Signed)
Requested medication (s) are due for refill today: courtesy refill   Requested medication (s) are on the active medication list: yes   Last refill:  05/06/23 #30 0 refills   Future visit scheduled: no   Notes to clinic:   called patient to schedule appt for med refills. No answer unable to leave message recording patient # no longer in service. Requesting Pharmacy comment: REQUEST FOR 90 DAYS PRESCRIPTION.  Do you want to give another courtesy refill?     Requested Prescriptions  Pending Prescriptions Disp Refills   PARoxetine (PAXIL) 20 MG tablet [Pharmacy Med Name: PAROXETINE HCL 20 MG TABLET] 90 tablet 1    Sig: TAKE 1 TABLET BY MOUTH EVERY DAY IN THE MORNING     Psychiatry:  Antidepressants - SSRI Failed - 06/03/2023 10:30 AM      Failed - Valid encounter within last 6 months    Recent Outpatient Visits           9 months ago Acute non-recurrent maxillary sinusitis   [facility] [place], Salvadore Oxford, NP   11 months ago Viral URI with cough   [place] [facility] Smitty Cords, DO   1 year ago Encounter for general adult medical examination with abnormal findings   [place] [facility] [place], Salvadore Oxford, NP   1 year ago Generalized anxiety disorder   [facility] [place], Salvadore Oxford, [place]

## 2023-06-04 NOTE — Telephone Encounter (Signed)
Called [phone] to schedule appt for medication refills. Recording # no longer in service , try call again later. Unable to leave message.

## 2023-06-07 ENCOUNTER — Other Ambulatory Visit: Payer: Self-pay | Admitting: Internal Medicine

## 2023-06-07 NOTE — Telephone Encounter (Signed)
Requested medications are due for refill today.  yes  Requested medications are on the active medications list.  yes  Last refill. 05/06/2023 #30 0 rf  Future visit scheduled.   no  Notes to clinic.  Pt is more than 3 months over due for OV. Courtesy refill already given.    Requested Prescriptions  Pending Prescriptions Disp Refills   PARoxetine (PAXIL) 20 MG tablet [Pharmacy Med Name: PAROXETINE HCL 20 MG TABLET] 30 tablet 0    Sig: TAKE 1 TABLET BY MOUTH EVERY DAY IN THE MORNING     Psychiatry:  Antidepressants - SSRI Failed - 06/07/2023  2:57 AM      Failed - Valid encounter within last 6 months    Recent Outpatient Visits           9 months ago Acute non-recurrent maxillary sinusitis   [place] [facility] [place], Salvadore Oxford, NP   11 months ago Viral URI with cough   [place] [facility] Smitty Cords, DO   1 year ago Encounter for general adult medical examination with abnormal findings   [place] [facility] [place], Salvadore Oxford, NP   1 year ago Generalized anxiety disorder   [place] [facility] [place], Salvadore [place], [place]

## 2023-06-23 ENCOUNTER — Other Ambulatory Visit: Payer: Self-pay | Admitting: Internal Medicine

## 2023-06-24 ENCOUNTER — Other Ambulatory Visit: Payer: Self-pay | Admitting: Internal Medicine

## 2023-06-24 NOTE — Telephone Encounter (Signed)
Medication Refill -  Most Recent Primary Care Visit:  Provider: Lorre Munroe  Department: [facility]  Visit Type: OFFICE VISIT  Date: 08/14/2022  Medication: PARoxetine (PAXIL) 20 MG tablet   Has the patient contacted their pharmacy? Yes Pt states she has been out of  medication since this past Friday.  Is hoping to get this Rx asap  Is this the correct pharmacy for this prescription? Yes If no, delete pharmacy and type the correct one.  This is the patient's preferred pharmacy:  [id number] [place] - [address] [place] [address] Phone: [phone] Fax: [phone]   Has the prescription been filled recently? No  Is the patient out of the medication? Yes 4 days now  Has the patient been seen for an appointment in the last year OR does the patient have an upcoming appointment? Yes  Can we respond through MyChart? Yes  Agent: Please be advised that Rx refills may take up to 3 business days. We ask that you follow-up with your pharmacy.

## 2023-06-25 NOTE — Telephone Encounter (Signed)
Requested medication (s) are due for refill today:   Yes   #30 day courtesy refill was given in Oct.  Requested medication (s) are on the active medication list:   Yes  Future visit scheduled:   Yes 07/08/2023.    Last CPE 04/13/2022.    LOV for acute issue 08/14/2022   Last ordered: 05/06/2023 #30, 0 refills was given as a courtesy refill.  Returned for provider to review for refills prior to upcoming appt.     Requested Prescriptions  Pending Prescriptions Disp Refills   PARoxetine (PAXIL) 20 MG tablet [Pharmacy Med Name: PAROXETINE HCL 20 MG TABLET] 30 tablet 0    Sig: TAKE 1 TABLET BY MOUTH EVERY DAY IN THE MORNING     Psychiatry:  Antidepressants - SSRI Failed - 06/23/2023 11:31 AM      Failed - Valid encounter within last 6 months    Recent Outpatient Visits           10 months ago Acute non-recurrent maxillary sinusitis   [place] [facility] [place], Salvadore Oxford, NP   11 months ago Viral URI with cough   [place] [facility] Smitty Cords, DO   1 year ago Encounter for general adult medical examination with abnormal findings   [place] [facility] [place], Salvadore Oxford, NP   1 year ago Generalized anxiety disorder   [place] [facility] [place], Salvadore Oxford, NP       Future Appointments             In 1 week Sampson Si, Salvadore Oxford, NP [place] [facility], [facility]

## 2023-06-25 NOTE — Telephone Encounter (Signed)
Requested Prescriptions  Refused Prescriptions Disp Refills   PARoxetine (PAXIL) 20 MG tablet 30 tablet 0     Psychiatry:  Antidepressants - SSRI Failed - 06/24/2023 12:41 PM      Failed - Valid encounter within last 6 months    Recent Outpatient Visits           10 months ago Acute non-recurrent maxillary sinusitis   [place] [facility] [place], Salvadore Oxford, NP   11 months ago Viral URI with cough   [place] [facility] Smitty Cords, DO   1 year ago Encounter for general adult medical examination with abnormal findings   [place] [facility] [place], Salvadore Oxford, NP   1 year ago Generalized anxiety disorder   [place] [facility] [place], Salvadore Oxford, NP       Future Appointments             In 1 week Sampson Si, Salvadore Oxford, NP [place] [facility], [facility]

## 2023-07-08 ENCOUNTER — Ambulatory Visit (INDEPENDENT_AMBULATORY_CARE_PROVIDER_SITE_OTHER): Payer: BC Managed Care – PPO | Admitting: Internal Medicine

## 2023-07-08 ENCOUNTER — Encounter: Payer: Self-pay | Admitting: Internal Medicine

## 2023-07-08 VITALS — BP 110/62 | Ht 66.0 in | Wt 131.0 lb

## 2023-07-08 DIAGNOSIS — R739 Hyperglycemia, unspecified: Secondary | ICD-10-CM

## 2023-07-08 DIAGNOSIS — Z136 Encounter for screening for cardiovascular disorders: Secondary | ICD-10-CM

## 2023-07-08 DIAGNOSIS — Z0001 Encounter for general adult medical examination with abnormal findings: Secondary | ICD-10-CM

## 2023-07-08 MED ORDER — PAROXETINE HCL 20 MG PO TABS: 20.0000 mg | ORAL_TABLET | Freq: Every day | ORAL | 1 refills | Status: DC

## 2023-07-08 NOTE — Patient Instructions (Signed)

## 2023-07-08 NOTE — Progress Notes (Signed)
Subjective:    Patient ID: Donna Rocha, female    DOB: 12-01-1970, 52 y.o.   MRN: [id number]  HPI  Patient presents to clinic today for her annual exam.   Flu: never Tetanus: 10/2019 COVID: never Shingrix: never Pap smear: < 5 years ago, [facility] Mammogram: 12/2022 Colon screening: Cologuard Vision screening: annually Dentist: biannually  Diet: She does eat meat. She consumes fruits and veggies. She does eat some fried foods. She drinks mostly juice, sweet tea. Exercise: None  Review of Systems     No past medical history on file.  Current Outpatient Medications  Medication Sig Dispense Refill   azithromycin (ZITHROMAX) 250 MG tablet Take 2 tabs today, then 1 tab daily x 4 days 6 tablet 0   fluticasone (FLONASE) 50 MCG/ACT nasal spray 2 SPRAYS IN EACH NOSTRIL 48 mL 1   PARoxetine (PAXIL) 20 MG tablet TAKE 1 TABLET BY MOUTH EVERY DAY IN THE MORNING 30 tablet 0   predniSONE (DELTASONE) 10 MG tablet Take 6 tabs on day 1, 5 tabs on day 2, 4 tabs on day 3, 3 tabs on day 4, 2 tabs on day 5, 1 tab on day 6 21 tablet 0   No current facility-administered medications for this visit.    No Known Allergies  Family History  Problem Relation Age of Onset   Lung cancer Mother    Diabetes Father    Hypertension Father    Healthy Sister    Breast cancer Maternal Grandmother 104   Congestive Heart Failure Paternal Grandmother    Colon cancer Neg Hx     Social History   Socioeconomic History   Marital status: Single    Spouse name: Not on file   Number of children: Not on file   Years of education: Not on file   Highest education level: Not on file  Occupational History   Not on file  Tobacco Use   Smoking status: Never   Smokeless tobacco: Never  Vaping Use   Vaping status: Never Used  Substance and Sexual Activity   Alcohol use: Not on file   Drug use: Not on file   Sexual activity: Not on file  Other Topics Concern   Not on file  Social  History Narrative   Not on file   Social Determinants of Health   Financial Resource Strain: Not on file  Food Insecurity: Not on file  Transportation Needs: Not on file  Physical Activity: Not on file  Stress: Not on file  Social Connections: Not on file  Intimate Partner Violence: Not on file     Constitutional: Denies fever, malaise, fatigue, headache or abrupt weight changes.  HEENT: Denies eye pain, eye redness, ear pain, ringing in the ears, wax buildup, runny nose, nasal congestion, bloody nose, or sore throat. Respiratory: Denies difficulty breathing, shortness of breath, cough or sputum production.   Cardiovascular: Denies chest pain, chest tightness, palpitations or swelling in the hands or feet.  Gastrointestinal: Denies abdominal pain, bloating, constipation, diarrhea or blood in the stool.  GU: Pt reports irregular periods. Denies urgency, frequency, pain with urination, burning sensation, blood in urine, odor or discharge. Musculoskeletal: Pt reports intermittent left shoulder pain. Denies decrease in range of motion, difficulty with gait, muscle pain or joint pain and swelling.  Skin: Denies redness, rashes, lesions or ulcercations.  Neurological: Denies dizziness, difficulty with memory, difficulty with speech or problems with balance and coordination.  Psych: Patient has a history of anxiety.  Denies depression, SI/HI.  No other specific complaints in a complete review of systems (except as listed in HPI above).  Objective:   Physical Exam BP 110/62   Ht 5\' 6"  (1.676 m)   Wt 131 lb (59.4 kg)   BMI 21.14 kg/m    Wt Readings from Last 3 Encounters:  08/14/22 133 lb (60.3 kg)  07/02/22 132 lb (59.9 kg)  04/13/22 134 lb (60.8 kg)    General: Appears her stated age, well developed, well nourished in NAD. Skin: Warm, dry and intact.  HEENT: Head: normal shape and size; Eyes: sclera white, no icterus, conjunctiva pink, PERRLA and EOMs intact;  Neck:  Neck  supple, trachea midline. No masses, lumps or thyromegaly present.  Cardiovascular: Normal rate and rhythm. S1,S2 noted.  No murmur, rubs or gallops noted. No JVD or BLE edema. No carotid bruits noted. Pulmonary/Chest: Normal effort and positive vesicular breath sounds. No respiratory distress. No wheezes, rales or ronchi noted.  Abdomen: Normal bowel sounds Musculoskeletal: Strength 5/5 BUE/BLE. No difficulty with gait.  Neurological: Alert and oriented. Cranial nerves II-XII grossly intact. Coordination normal.  Psychiatric: Mood and affect normal. Behavior is normal. Judgment and thought content normal.       Assessment & Plan:   Preventative Health Maintenance:  She declines flu shot today Tetanus UTD  Encouraged her to get her COVID-vaccine Discussed Shingrix vaccine, she will check coverage with her insurance company and schedule a nurse visit if she would like to have this done Pap smear UTD per her report, will request copy Mammogram UTD Colon screening UTD per her report, will request copy Encouraged her to consume a balanced diet and exercise regimen Advised her to see an eye doctor and dentist annually We will check CBC, c-Met, lipid, A1c today  RTC in 6 months, follow-up chronic conditions Nicki Reaper, NP

## 2023-07-09 ENCOUNTER — Encounter: Payer: Self-pay | Admitting: Internal Medicine

## 2023-07-09 LAB — COMPLETE METABOLIC PANEL WITH GFR
AG Ratio: 1.7 (calc) (ref 1.0–2.5)
ALT: 9 U/L (ref 6–29)
AST: 15 U/L (ref 10–35)
Albumin: 4.6 g/dL (ref 3.6–5.1)
Alkaline phosphatase (APISO): 55 U/L (ref 37–153)
BUN: 18 mg/dL (ref 7–25)
CO2: 27 mmol/L (ref 20–32)
Calcium: 10.4 mg/dL (ref 8.6–10.4)
Chloride: 109 mmol/L (ref 98–110)
Creat: 0.77 mg/dL (ref 0.50–1.03)
Globulin: 2.7 g/dL (ref 1.9–3.7)
Glucose, Bld: 82 mg/dL (ref 65–139)
Potassium: 4 mmol/L (ref 3.5–5.3)
Sodium: 144 mmol/L (ref 135–146)
Total Bilirubin: 0.7 mg/dL (ref 0.2–1.2)
Total Protein: 7.3 g/dL (ref 6.1–8.1)
eGFR: 93 mL/min/{1.73_m2} (ref >=60)

## 2023-07-09 LAB — HEMOGLOBIN A1C
Hgb A1c MFr Bld: 5.7 %{Hb} — ABNORMAL HIGH (ref <5.7)
Mean Plasma Glucose: 117 mg/dL
eAG (mmol/L): 6.5 mmol/L

## 2023-07-09 LAB — LIPID PANEL
Cholesterol: 215 mg/dL — ABNORMAL HIGH (ref <200)
HDL: 63 mg/dL (ref >=50)
LDL Cholesterol (Calc): 124 mg/dL — ABNORMAL HIGH
Non-HDL Cholesterol (Calc): 152 mg/dL — ABNORMAL HIGH (ref <130)
Total CHOL/HDL Ratio: 3.4 (calc) (ref <5.0)
Triglycerides: 161 mg/dL — ABNORMAL HIGH (ref <150)

## 2023-07-09 LAB — CBC
HCT: 39.3 % (ref 35.0–45.0)
Hemoglobin: 13 g/dL (ref 11.7–15.5)
MCH: 31.9 pg (ref 27.0–33.0)
MCHC: 33.1 g/dL (ref 32.0–36.0)
MCV: 96.3 fL (ref 80.0–100.0)
MPV: 11.6 fL (ref 7.5–12.5)
Platelets: 261 10*3/uL (ref 140–400)
RBC: 4.08 10*6/uL (ref 3.80–5.10)
RDW: 11.7 % (ref 11.0–15.0)
WBC: 3.6 10*3/uL — ABNORMAL LOW (ref 3.8–10.8)

## 2023-07-10 ENCOUNTER — Encounter: Payer: Self-pay | Admitting: Internal Medicine

## 2023-09-13 ENCOUNTER — Other Ambulatory Visit: Payer: Self-pay | Admitting: Internal Medicine

## 2023-09-13 DIAGNOSIS — Z0001 Encounter for general adult medical examination with abnormal findings: Secondary | ICD-10-CM

## 2023-09-16 NOTE — Telephone Encounter (Signed)
 Requested Prescriptions  Refused Prescriptions Disp Refills   PARoxetine (PAXIL) 20 MG tablet [Pharmacy Med Name: PAROXETINE HCL 20 MG TABLET] 90 tablet 2    Sig: TAKE 1 TABLET BY MOUTH EVERY DAY     Psychiatry:  Antidepressants - SSRI Passed - 09/16/2023  8:28 AM      Passed - Valid encounter within last 6 months    Recent Outpatient Visits           2 months ago Encounter for general adult medical examination with abnormal findings   [place] [facility] [place], Salvadore Oxford, NP   1 year ago Acute non-recurrent maxillary sinusitis   [place] [facility] [place], Salvadore Oxford, NP   1 year ago Viral URI with cough   [place] [facility] Smitty Cords, DO   1 year ago Encounter for general adult medical examination with abnormal findings   [place] [facility] [place], Salvadore Oxford, NP   1 year ago Generalized anxiety disorder    [place] [facility] [place], Salvadore [place], [place]

## 2024-01-07 ENCOUNTER — Ambulatory Visit: Admitting: Internal Medicine

## 2024-01-07 ENCOUNTER — Encounter: Payer: Self-pay | Admitting: Internal Medicine

## 2024-01-07 ENCOUNTER — Other Ambulatory Visit (HOSPITAL_COMMUNITY)
Admission: RE | Admit: 2024-01-07 | Discharge: 2024-01-07 | Disposition: A | Discharge status: Home / Self Care | Source: Ambulatory Visit | Attending: Internal Medicine | Admitting: Internal Medicine

## 2024-01-07 VITALS — BP 112/68 | Ht 66.0 in | Wt 128.0 lb

## 2024-01-07 DIAGNOSIS — Z124 Encounter for screening for malignant neoplasm of cervix: Secondary | ICD-10-CM | POA: Insufficient documentation

## 2024-01-07 DIAGNOSIS — F411 Generalized anxiety disorder: Secondary | ICD-10-CM

## 2024-01-07 DIAGNOSIS — G8929 Other chronic pain: Secondary | ICD-10-CM

## 2024-01-07 DIAGNOSIS — Z01419 Encounter for gynecological examination (general) (routine) without abnormal findings: Secondary | ICD-10-CM | POA: Diagnosis not present

## 2024-01-07 DIAGNOSIS — M25512 Pain in left shoulder: Secondary | ICD-10-CM

## 2024-01-07 MED ORDER — METHOCARBAMOL 500 MG PO TABS: 500.0000 mg | ORAL_TABLET | Freq: Every evening | ORAL | 0 refills | Status: DC | PRN

## 2024-01-07 NOTE — Assessment & Plan Note (Signed)
 Continue ibuprofen and heat patches as needed We will methocarbamol 500 mg at bedtime

## 2024-01-07 NOTE — Assessment & Plan Note (Signed)
 Will have her change her paroxetine  20 mg to daily in the morning to see if this helps with her dizziness We will monitor

## 2024-01-07 NOTE — Progress Notes (Signed)
 Subjective:    Patient ID: Donna Rocha, female    DOB: 19-Jul-1971, 53 y.o.   MRN: [id number]  HPI  Patient presents to clinic today for follow-up of chronic conditions.  Anxiety: Chronic, managed on paroxetine .  She has been experience some dizziness shortly after taking the paroxetine  but is not sure if it is related.  She is not currently seeing a therapist.  She denies depression, SI/HI.  Chronic left shoulder pain: Work related. Managed with ibuprofen as needed. She also uses heat patches with minimal relief of symptoms.   There is no imaging on file.  She does not follow with orthopedics.  She would also like to have her Pap smear today.  Her last Pap smear was > 5 years. She is having irregular spotting. She is having hot flashes.  Review of Systems     No past medical history on file.  Current Outpatient Medications  Medication Sig Dispense Refill   fluticasone  (FLONASE ) 50 MCG/ACT nasal spray 2 SPRAYS IN EACH NOSTRIL 48 mL 1   PARoxetine  (PAXIL ) 20 MG tablet Take 1 tablet (20 mg total) by mouth daily. 90 tablet 1   No current facility-administered medications for this visit.    No Known Allergies  Family History  Problem Relation Age of Onset   Lung cancer Mother    Diabetes Father    Hypertension Father    Healthy Sister    Breast cancer Maternal Grandmother 50   Congestive Heart Failure Paternal Grandmother    Colon cancer Neg Hx     Social History   Socioeconomic History   Marital status: Single    Spouse name: Not on file   Number of children: Not on file   Years of education: Not on file   Highest education level: Not on file  Occupational History   Not on file  Tobacco Use   Smoking status: Never   Smokeless tobacco: Never  Vaping Use   Vaping status: Never Used  Substance and Sexual Activity   Alcohol use: Not on file   Drug use: Not on file   Sexual activity: Not on file  Other Topics Concern   Not on file  Social History Narrative    Not on file   Social Drivers of Health   Financial Resource Strain: Not on file  Food Insecurity: Not on file  Transportation Needs: Not on file  Physical Activity: Not on file  Stress: Not on file  Social Connections: Not on file  Intimate Partner Violence: Not on file     Constitutional: Denies fever, malaise, fatigue, headache or abrupt weight changes.  Respiratory: Denies difficulty breathing, shortness of breath, cough or sputum production.   Cardiovascular: Denies chest pain, chest tightness, palpitations or swelling in the hands or feet.  Gastrointestinal: Denies abdominal pain, bloating, constipation, diarrhea or blood in the stool.  GU: Pt reports irregular periods. Denies urgency, frequency, pain with urination, burning sensation, blood in urine, odor or discharge. Musculoskeletal: Pt reports intermittent left shoulder pain. Denies decrease in range of motion, difficulty with gait, muscle pain or joint pain and swelling.  Skin: Denies redness, rashes, lesions or ulcercations.  Neurological: Pt reports intermittent dizziness. Denies difficulty with memory, difficulty with speech or problems with balance and coordination.  Psych: Patient has a history of anxiety.  Denies depression, SI/HI.  No other specific complaints in a complete review of systems (except as listed in HPI above).  Objective:   Physical Exam BP 112/68 (BP  Location: Left Arm, Patient Position: Sitting, Cuff Size: Normal)   Ht 5\' 6"  (1.676 m)   Wt 128 lb (58.1 kg)   LMP  (LMP Unknown)   BMI 20.66 kg/m     Wt Readings from Last 3 Encounters:  07/08/23 131 lb (59.4 kg)  08/14/22 133 lb (60.3 kg)  07/02/22 132 lb (59.9 kg)    General: Appears her stated age, well developed, well nourished in NAD. Skin: Warm, dry and intact.  HEENT: Head: normal shape and size; Eyes: sclera white, no icterus, conjunctiva pink, PERRLA and EOMs intact;  Cardiovascular: Normal rate and rhythm. S1,S2 noted.  No murmur,  rubs or gallops noted.  Pulmonary/Chest: Normal effort and positive vesicular breath sounds. No respiratory distress. No wheezes, rales or ronchi noted.  Abdomen: Normal bowel sounds.  No distention or masses noted. Pelvic: Normal female anatomy.  Cervix without mass or lesion.  No CMT.  Adnexa nonpalpable. Musculoskeletal: Normal internal and external rotation of the left shoulder.  No joint swelling noted.  Strength 5/5 BUE/BLE. No difficulty with gait.  Neurological: Alert and oriented. Cranial nerves II-XII grossly intact. Coordination normal.  Psychiatric: Mood and affect normal. Behavior is normal. Judgment and thought content normal.       Assessment & Plan:   Screen for cervical cancer:  Pap smear today with STD screening  RTC in 6 months for your annual exam Helayne Lo, NP

## 2024-01-07 NOTE — Patient Instructions (Signed)
 Dizziness Dizziness is a common problem. It makes you feel unsteady or light-headed. You may feel like you're about to faint. Dizziness can lead to getting hurt if you stumble or fall. It's more common to feel dizzy if you're an older adult. Many things can cause you to feel dizzy. These include: Medicines. Dehydration. This is when there's not enough water in your body. Illness. Follow these instructions at home: Eating and drinking  Drink enough fluid to keep your pee (urine) pale yellow. This helps keep you from getting dehydrated. Try to drink more clear fluids, such as water. Do not drink alcohol. Try to limit how much caffeine you take in. Try to limit how much salt, also called sodium, you take in. Activity Try not to make quick movements. Stand up slowly from sitting in a chair. Steady yourself until you feel okay. In the morning, first sit up on the side of the bed. When you feel okay, hold onto something and slowly stand up. Do this until you know that your balance is okay. If you need to stand in one place for a long time, move your legs often. Tighten and relax the muscles in your legs while you're standing. Do not drive or use machines if you feel dizzy. Avoid bending down if you feel dizzy. Place items in your home so you can reach them without leaning over. Lifestyle Do not smoke, vape, or use products with nicotine or tobacco in them. If you need help quitting, talk with your health care provider. Try to lower your stress level. You can do this by using methods like yoga or meditation. Talk with your provider if you need help. General instructions Watch your dizziness for any changes. Take your medicines only as told by your provider. Talk with your provider if you think you're dizzy because of a medicine you're taking. Tell a friend or a family member that you're feeling dizzy. If they spot any changes in your behavior, have them call your provider. Contact a health care  provider if: Your dizziness doesn't go away, or you have new symptoms. Your dizziness gets worse. You feel like you may vomit. You have trouble hearing. You have a fever. You have neck pain or a stiff neck. You fall or get hurt. Get help right away if: You vomit each time you eat or drink. You have watery poop and can't eat or drink. You have trouble talking, walking, swallowing, or using your arms, hands, or legs. You feel very weak. You're bleeding. You're not thinking clearly, or you have trouble forming sentences. A friend or family member may spot this. Your vision changes, or you get a very bad headache. These symptoms may be an emergency. Call 911 right away. Do not wait to see if the symptoms will go away. Do not drive yourself to the hospital. This information is not intended to replace advice given to you by your health care provider. Make sure you discuss any questions you have with your health care provider. Document Revised: 04/18/2023 Document Reviewed: 08/30/2022 Elsevier Patient Education  2024 [employer].

## 2024-01-08 LAB — CYTOLOGY - PAP
Adequacy: ABSENT
Chlamydia: NEGATIVE
Comment: NEGATIVE
Comment: NEGATIVE
Comment: NEGATIVE
Comment: NORMAL
Diagnosis: NEGATIVE
High risk HPV: NEGATIVE
Neisseria Gonorrhea: NEGATIVE
Trichomonas: NEGATIVE

## 2024-01-09 ENCOUNTER — Ambulatory Visit: Payer: Self-pay | Admitting: Internal Medicine

## 2024-01-15 ENCOUNTER — Other Ambulatory Visit: Payer: Self-pay | Admitting: Internal Medicine

## 2024-01-15 DIAGNOSIS — Z0001 Encounter for general adult medical examination with abnormal findings: Secondary | ICD-10-CM

## 2024-01-17 NOTE — Telephone Encounter (Signed)
 LOV 01/07/2024.   Has upcoming visit 07/09/2024.  Requested Prescriptions  Pending Prescriptions Disp Refills   PARoxetine  (PAXIL ) 20 MG tablet [Pharmacy Med Name: PAROXETINE  HCL 20 MG TABLET] 30 tablet 5    Sig: TAKE 1 TABLET BY MOUTH EVERY DAY     Psychiatry:  Antidepressants - SSRI Failed - 01/17/2024 10:25 AM      Failed - Valid encounter within last 6 months    Recent Outpatient Visits           1 week ago Encounter for gynecological examination without abnormal finding   [facility] [place], [place], [place]

## 2024-03-13 ENCOUNTER — Ambulatory Visit: Admitting: Internal Medicine

## 2024-03-13 ENCOUNTER — Encounter: Payer: Self-pay | Admitting: Internal Medicine

## 2024-03-13 VITALS — BP 102/64 | HR 62 | Ht 66.0 in | Wt 129.2 lb

## 2024-03-13 DIAGNOSIS — T887XXA Unspecified adverse effect of drug or medicament, initial encounter: Secondary | ICD-10-CM

## 2024-03-13 DIAGNOSIS — G8929 Other chronic pain: Secondary | ICD-10-CM

## 2024-03-13 DIAGNOSIS — F411 Generalized anxiety disorder: Secondary | ICD-10-CM | POA: Diagnosis not present

## 2024-03-13 DIAGNOSIS — M542 Cervicalgia: Secondary | ICD-10-CM | POA: Diagnosis not present

## 2024-03-13 DIAGNOSIS — R001 Bradycardia, unspecified: Secondary | ICD-10-CM | POA: Diagnosis not present

## 2024-03-13 MED ORDER — PAROXETINE HCL 40 MG PO TABS: 40.0000 mg | ORAL_TABLET | ORAL | 1 refills | Status: DC

## 2024-03-13 NOTE — Patient Instructions (Signed)
 Bradycardia, Adult Bradycardia is a slower-than-normal heartbeat. A normal resting heart rate for an adult ranges from 60 to 100 beats per minute. With bradycardia, the resting heart rate is less than 60 beats per minute. Bradycardia can prevent enough oxygen  from reaching certain areas of your body when you are active. It can be serious if it keeps enough oxygen  from reaching your brain and other parts of your body. Bradycardia is not a problem for everyone. For some healthy adults, a slow resting heart rate is normal. What are the causes? This condition may be caused by: A problem with the heart, including: A problem with the heart's electrical system, such as a heart block. With a heart block, electrical signals between the chambers of the heart are partially or completely blocked, so they are not able to work as they should. A problem with the heart's natural pacemaker (sinus node). Heart disease. A heart attack. Heart damage. Lyme disease. A heart infection. A heart condition that is present at birth (congenital heart defect). Certain medicines that treat heart conditions. Certain conditions, such as hypothyroidism and obstructive sleep apnea. Problems with the balance of chemicals and other substances, like potassium, in the blood. Trauma. Radiation therapy. What increases the risk? You are more likely to develop this condition if you: Are age 30 or older. Have high blood pressure (hypertension), high cholesterol (hyperlipidemia), or diabetes. Drink heavily, use tobacco or nicotine products, or use drugs. What are the signs or symptoms? Symptoms of this condition include: Light-headedness. Feeling faint or fainting. Fatigue and weakness. Trouble with activity or exercise. Shortness of breath. Chest pain (angina). Drowsiness. Confusion. Dizziness. How is this diagnosed? This condition may be diagnosed based on: Your symptoms. Your medical history. A physical exam. During  the exam, your health care provider will listen to your heartbeat and check your pulse. To confirm the diagnosis, your health care provider may order tests, such as: Blood tests. An electrocardiogram (ECG). This test records the heart's electrical activity. The test can show how fast your heart is beating and whether the heartbeat is steady. A test in which you wear a portable device (event recorder or Holter monitor) to record your heart's electrical activity while you go about your day. An exercise test. How is this treated? Treatment for this condition depends on the cause of the condition and how severe your symptoms are. Treatment may involve: Treatment of the underlying condition. Changing your medicines or how much medicine you take. Having a small, battery-operated device called a pacemaker implanted under the skin. When bradycardia occurs, this device can be used to increase your heart rate and help your heart beat in a regular rhythm. Follow these instructions at home: Lifestyle Manage any health conditions that contribute to bradycardia as told by your health care provider. Follow a heart-healthy diet. A nutrition specialist (dietitian) can help educate you about healthy food options and changes. Follow an exercise program that is approved by your health care provider. Maintain a healthy weight. Try to reduce or manage your stress, such as with yoga or meditation. If you need help reducing stress, ask your health care provider. Do not use any products that contain nicotine or tobacco. These products include cigarettes, chewing tobacco, and vaping devices, such as e-cigarettes. If you need help quitting, ask your health care provider. Do not use illegal drugs. Alcohol  use If you drink alcohol : Limit how much you have to: 0-1 drink a day for women who are not pregnant. 0-2 drinks a day  for men. Know how much alcohol  is in a drink. In the [place] one drink equals one 12 oz bottle of  beer (355 mL), one 5 oz glass of wine (148 mL), or one 1 oz glass of hard liquor (44 mL). General instructions Take over-the-counter and prescription medicines only as told by your health care provider. Keep all follow-up visits. This is important. How is this prevented? In some cases, bradycardia may be prevented by: Treating underlying medical problems. Stopping behaviors or medicines that can trigger the condition. Contact a health care provider if: You feel light-headed or dizzy. You almost faint. You feel weak or are easily fatigued during physical activity. You experience confusion or have memory problems. Get help right away if: You faint. You have chest pains or an irregular heartbeat (palpitations). You have trouble breathing. These symptoms may represent a serious problem that is an emergency. Do not wait to see if the symptoms will go away. Get medical help right away. Call your local emergency services (911 in the [place] Do not drive yourself to the hospital. Summary Bradycardia is a slower-than-normal heartbeat. With bradycardia, the resting heart rate is less than 60 beats per minute. Treatment for this condition depends on the cause. Manage any health conditions that contribute to bradycardia as told by your health care provider. Do not use any products that contain nicotine or tobacco. These products include cigarettes, chewing tobacco, and vaping devices, such as e-cigarettes. Keep all follow-up visits. This is important. This information is not intended to replace advice given to you by your health care provider. Make sure you discuss any questions you have with your health care provider. Document Revised: 11/06/2020 Document Reviewed: 11/06/2020 Elsevier Patient Education  2024 [employer].

## 2024-03-13 NOTE — Progress Notes (Signed)
 Subjective:   HPI  Discussed the use of AI scribe software for clinical note transcription with the patient, who gave verbal consent to proceed.  Donna Rocha is a 53 year old female who presents for follow-up after an urgent care visit for bradycardia.  Approximately two weeks ago, she visited urgent care due to concerns about COVID-19 exposure at her workplace. Although she tested negative for COVID-19, she was found to have a low heart rate of 45 beats per minute. An EKG confirmed the slow heart rate, though the rhythm was not specified.  She experiences intermittent symptoms of dizziness, feeling 'funny,' and occasional chest pain, which she attributes to physical exertion at work. No episodes of syncope have occurred. Her heart rate increased to 55 beats per minute after a weekend, and she associates the initial low heart rate with the use of an Ashwagandha supplement, which she had been taking for one to two weeks prior to the urgent care visit. She did not take the supplement on the day of the urgent care visit but had taken it the day before.  Family history is significant for her maternal grandfather having had a pacemaker and both grandfathers having died from heart attacks.      She is also due for follow-up of chronic conditions.  Anxiety: Chronic, managed on paroxetine .  She has been under a lot of stress lately between work and caring for family members.  She is not currently seeing a therapist.  She denies depression, SI/HI.  Intermittent neck pain: Managed with methocarbamol  as needed.  There is no imaging on file.  She does not follow with orthopedics.  No past medical history on file.  Current Outpatient Medications  Medication Sig Dispense Refill   fluticasone  (FLONASE ) 50 MCG/ACT nasal spray 2 SPRAYS IN EACH NOSTRIL 48 mL 1   methocarbamol  (ROBAXIN ) 500 MG tablet Take 1 tablet (500 mg total) by mouth at bedtime as needed. 90 tablet 0   PARoxetine  (PAXIL ) 20 MG  tablet TAKE 1 TABLET BY MOUTH EVERY DAY 30 tablet 5   No current facility-administered medications for this visit.    No Known Allergies  Family History  Problem Relation Age of Onset   Lung cancer Mother    Diabetes Father    Hypertension Father    Healthy Sister    Breast cancer Maternal Grandmother 61   Congestive Heart Failure Paternal Grandmother    Colon cancer Neg Hx     Social History   Socioeconomic History   Marital status: Single    Spouse name: Not on file   Number of children: Not on file   Years of education: Not on file   Highest education level: Not on file  Occupational History   Not on file  Tobacco Use   Smoking status: Never   Smokeless tobacco: Never  Vaping Use   Vaping status: Never Used  Substance and Sexual Activity   Alcohol use: Not on file   Drug use: Not on file   Sexual activity: Not on file  Other Topics Concern   Not on file  Social History Narrative   Not on file   Social Drivers of Health   Financial Resource Strain: Not on file  Food Insecurity: Not on file  Transportation Needs: Not on file  Physical Activity: Not on file  Stress: Not on file  Social Connections: Not on file  Intimate Partner Violence: Not on file    ROS:  Constitutional: Denies fever,  malaise, fatigue, headache or abrupt weight changes.  HEENT: Denies eye pain, eye redness, ear pain, ringing in the ears, wax buildup, runny nose, nasal congestion, bloody nose, or sore throat. Respiratory: Denies difficulty breathing, shortness of breath, cough or sputum production.   Cardiovascular: Pt reports intermittent chest pain. Denies chest tightness, palpitations or swelling in the hands or feet.  Gastrointestinal: Denies abdominal pain, bloating, constipation, diarrhea or blood in the stool.  GU: Denies frequency, urgency, pain with urination, blood in urine, odor or discharge. Musculoskeletal: Patient reports intermittent neck pain.  Denies decrease in range of  motion, difficulty with gait, or joint swelling.  Skin: Denies redness, rashes, lesions or ulcercations.  Neurological: Pt reports intermittent dizziness. Denies difficulty with memory, difficulty with speech or problems with balance and coordination.  Psych: Patient has a history of anxiety.  Denies depression, SI/HI.  No other specific complaints in a complete review of systems (except as listed in HPI above).  PE:  BP 102/64 (BP Location: Left Arm, Patient Position: Sitting, Cuff Size: Normal)   Pulse 62   Ht 5' 6 (1.676 m)   Wt 129 lb 3.2 oz (58.6 kg)   SpO2 97%   BMI 20.85 kg/m   Wt Readings from Last 3 Encounters:  01/07/24 128 lb (58.1 kg)  07/08/23 131 lb (59.4 kg)  08/14/22 133 lb (60.3 kg)    General: Appears her stated age, well developed, well nourished in NAD. HEENT: Head: normal shape and size; Eyes: sclera white, PERRLA and EOMs intact;  Cardiovascular: Normal rate and rhythm. S1,S2 noted.  No murmur, rubs or gallops noted.  Pulmonary/Chest: Normal effort and positive vesicular breath sounds. No respiratory distress. No wheezes, rales or ronchi noted.  Musculoskeletal: Normal flexion, extension and rotation of the cervical spine.  No difficulty with gait.  Neurological: Alert and oriented. Psychiatric: Mood and affect normal. Behavior is normal. Judgment and thought content normal.     Assessment and Plan:  Assessment and Plan    Bradycardia Bradycardia with heart rate of 45, improved to 62 after stopping Ashwagandha. No cardiology referral needed unless heart rate consistently <50. - Discontinue Ashwagandha supplement. - Purchase pulse oximeter for regular heart rate monitoring. - Ensure heart rate >50; evaluate if it drops to 40s or 30s.  Intermittent chest pain and dizziness Intermittent chest pain and dizziness possibly related to bradycardia or muscular from physical activity. No syncope. - She prefers to monitor symptoms at this time   RTC in 4  months for annual exam Angeline Laura, NP

## 2024-03-13 NOTE — Assessment & Plan Note (Signed)
 Encouraged regular stretching Continue methocarbamol  500 mg every 8 hours as needed

## 2024-03-13 NOTE — Assessment & Plan Note (Signed)
 Deteriorated due to increased stress Will increase paroxetine  to 40 mg daily Support offered

## 2024-03-25 ENCOUNTER — Telehealth: Payer: Self-pay

## 2024-03-25 NOTE — Telephone Encounter (Signed)
 Copied from CRM [id number]. Topic: Clinical - Medication Question >> Mar 25, 2024 12:10 PM Donna Rocha wrote: PARoxetine  (PAXIL ) 40 MG tablet- Patient says the 40 makes her stomach hurt. And wants to go back to 20 MG

## 2024-03-26 MED ORDER — PAROXETINE HCL 20 MG PO TABS: 20.0000 mg | ORAL_TABLET | ORAL | 1 refills | Status: AC

## 2024-03-26 NOTE — Telephone Encounter (Signed)
 Copied from CRM #[id number]. Topic: General - Other >> Mar 26, 2024  4:03 PM Fonda T wrote: Reason for CRM: Recived call from [employer]ient t, checking status of previous request on decreasing dose of medication, Paxil .  Per chart review and provider notes, patient informed, medication PARoxetine  (PAXIL ) 20 MG tablet, was e-scribed to pharmacy,  E-Prescribing Status: Receipt confirmed by pharmacy (03/26/2024  7:55 AM EDT), sent to [id number] [address] [place], [facility] [address], [place] [address] Phone: [phone]  Fax: [phone]   Patient informed of above, Patient verbalized understanding. No further assistance needed at this time.   Sending CRM to office to inform patient aware.

## 2024-03-26 NOTE — Telephone Encounter (Signed)
 I have sent in the 20 mg tablets

## 2024-03-26 NOTE — Telephone Encounter (Signed)
 Left message for patient to return call OK to advise

## 2024-03-26 NOTE — Addendum Note (Signed)
 Addended by: ANTONETTE ANGELINE ORN on: 03/26/2024 07:55 AM   Modules accepted: Orders

## 2024-07-09 ENCOUNTER — Ambulatory Visit: Admitting: Internal Medicine

## 2024-07-09 ENCOUNTER — Encounter: Payer: Self-pay | Admitting: Internal Medicine

## 2024-07-09 VITALS — BP 132/72 | HR 51 | Ht 66.0 in | Wt 130.0 lb

## 2024-07-09 DIAGNOSIS — Z136 Encounter for screening for cardiovascular disorders: Secondary | ICD-10-CM | POA: Diagnosis not present

## 2024-07-09 DIAGNOSIS — Z1231 Encounter for screening mammogram for malignant neoplasm of breast: Secondary | ICD-10-CM

## 2024-07-09 DIAGNOSIS — Z1211 Encounter for screening for malignant neoplasm of colon: Secondary | ICD-10-CM | POA: Diagnosis not present

## 2024-07-09 DIAGNOSIS — Z0000 Encounter for general adult medical examination without abnormal findings: Secondary | ICD-10-CM

## 2024-07-09 DIAGNOSIS — R739 Hyperglycemia, unspecified: Secondary | ICD-10-CM | POA: Diagnosis not present

## 2024-07-09 MED ORDER — FLUTICASONE PROPIONATE 50 MCG/ACT NA SUSP: NASAL | 1 refills | Status: AC

## 2024-07-09 MED ORDER — METHOCARBAMOL 500 MG PO TABS: 500.0000 mg | ORAL_TABLET | Freq: Every evening | ORAL | 0 refills | Status: AC | PRN

## 2024-07-09 NOTE — Progress Notes (Signed)
 Subjective:    Patient ID: Donna Rocha, female    DOB: [birth year], 53 y.o.   MRN: [id number]  HPI  Patient presents to clinic today for her annual exam.   Flu: never Tetanus: 10/2019 COVID: never Shingrix: never Pap smear: 12/2023 Mammogram: 12/2022 Colon screening: 05/2021 Cologuard Vision screening: annually Dentist: biannually  Diet: She does eat meat. She consumes fruits and veggies. She does eat some fried foods. She drinks mostly juice, sweet tea. Exercise: None  Review of Systems     No past medical history on file.  Current Outpatient Medications  Medication Sig Dispense Refill   ASHWAGANDHA GUMMIES PO Take by mouth daily.     fluticasone  (FLONASE ) 50 MCG/ACT nasal spray 2 SPRAYS IN EACH NOSTRIL 48 mL 1   methocarbamol  (ROBAXIN ) 500 MG tablet Take 1 tablet (500 mg total) by mouth at bedtime as needed. 90 tablet 0   PARoxetine  (PAXIL ) 20 MG tablet Take 1 tablet (20 mg total) by mouth every morning. 90 tablet 1   No current facility-administered medications for this visit.    No Known Allergies  Family History  Problem Relation Age of Onset   Lung cancer Mother    Diabetes Father    Hypertension Father    Healthy Sister    Breast cancer Maternal Grandmother 73   Congestive Heart Failure Paternal Grandmother    Colon cancer Neg Hx     Social History   Socioeconomic History   Marital status: Single    Spouse name: Not on file   Number of children: Not on file   Years of education: Not on file   Highest education level: Not on file  Occupational History   Not on file  Tobacco Use   Smoking status: Never   Smokeless tobacco: Never  Vaping Use   Vaping status: Never Used  Substance and Sexual Activity   Alcohol use: Not on file   Drug use: Not on file   Sexual activity: Not on file  Other Topics Concern   Not on file  Social History Narrative   Not on file   Social Drivers of Health   Tobacco Use: Low Risk  (03/13/2024)   Patient History    Smoking Tobacco Use: Never    Smokeless Tobacco Use: Never    Passive Exposure: Not on file  Financial Resource Strain: Not on file  Food Insecurity: Not on file  Transportation Needs: Not on file  Physical Activity: Not on file  Stress: Not on file  Social Connections: Not on file  Intimate Partner Violence: Not on file  Depression (PHQ2-9): Low Risk (03/13/2024)   Depression (PHQ2-9)    PHQ-2 Score: 0  Alcohol Screen: Low Risk (10/05/2021)   Alcohol Screen    Last Alcohol Screening Score (AUDIT): 2  Housing: Not on file  Utilities: Not on file  Health Literacy: Not on file     Constitutional: Denies fever, malaise, fatigue, headache or abrupt weight changes.  HEENT: Denies eye pain, eye redness, ear pain, ringing in the ears, wax buildup, runny nose, nasal congestion, bloody nose, or sore throat. Respiratory: Denies difficulty breathing, shortness of breath, cough or sputum production.   Cardiovascular: Denies chest pain, chest tightness, palpitations or swelling in the hands or feet.  Gastrointestinal: Denies abdominal pain, bloating, constipation, diarrhea or blood in the stool.  GU: Pt reports amenorrhea. Denies urgency, frequency, pain with urination, burning sensation, blood in urine, odor or discharge. Musculoskeletal: Pt reports chronic neck pain. Denies  decrease in range of motion, difficulty with gait, muscle pain or joint pain and swelling.  Skin: Denies redness, rashes, lesions or ulcercations.  Neurological: Denies dizziness, difficulty with memory, difficulty with speech or problems with balance and coordination.  Psych: Patient has a history of anxiety.  Denies depression, SI/HI.  No other specific complaints in a complete review of systems (except as listed in HPI above).  Objective:   Physical Exam BP 132/72 (BP Location: Right Arm, Patient Position: Sitting, Cuff Size: Normal)   Pulse (!) 51   Ht 5' 6 (1.676 m)   Wt 130  lb (59 kg)   SpO2 99%   BMI 20.98 kg/m     Wt Readings from Last 3 Encounters:  03/13/24 129 lb 3.2 oz (58.6 kg)  01/07/24 128 lb (58.1 kg)  07/08/23 131 lb (59.4 kg)    General: Appears her stated age, well developed, well nourished in NAD. Skin: Warm, dry and intact.  HEENT: Head: normal shape and size; Eyes: sclera white, no icterus, conjunctiva pink, PERRLA and EOMs intact;  Neck:  Neck supple, trachea midline. No masses, lumps or thyromegaly present.  Cardiovascular: Bradycardic with normal rhythm. S1,S2 noted.  No murmur, rubs or gallops noted. No JVD or BLE edema. No carotid bruits noted. Pulmonary/Chest: Normal effort and positive vesicular breath sounds. No respiratory distress. No wheezes, rales or ronchi noted.  Abdomen: Normal bowel sounds Musculoskeletal: Strength 5/5 BUE/BLE. No difficulty with gait.  Neurological: Alert and oriented. Cranial nerves II-XII grossly intact. Coordination normal.  Psychiatric: Mood and affect normal. Behavior is normal. Judgment and thought content normal.       Assessment & Plan:   Preventative Health Maintenance:  She declines flu shot today Tetanus UTD  Encouraged her to get her COVID-vaccine Discussed Shingrix vaccine, she will check coverage with her insurance company and schedule a nurse visit if she would like to have this done Pap smear UTD  Mammogram ordered-she will call to schedule Cologuard ordered Encouraged her to consume a balanced diet and exercise regimen Advised her to see an eye doctor and dentist annually We will check CBC, c-Met, lipid, A1c today  RTC in 6 months, follow-up chronic conditions Angeline Laura, NP

## 2024-07-09 NOTE — Patient Instructions (Signed)

## 2024-07-10 ENCOUNTER — Ambulatory Visit: Payer: Self-pay | Admitting: Internal Medicine

## 2024-07-10 LAB — COMPREHENSIVE METABOLIC PANEL WITH GFR
AG Ratio: 1.5 (calc) (ref 1.0–2.5)
ALT: 13 U/L (ref 6–29)
AST: 21 U/L (ref 10–35)
Albumin: 4.5 g/dL (ref 3.6–5.1)
Alkaline phosphatase (APISO): 59 U/L (ref 37–153)
BUN: 19 mg/dL (ref 7–25)
CO2: 27 mmol/L (ref 20–32)
Calcium: 10.1 mg/dL (ref 8.6–10.4)
Chloride: 106 mmol/L (ref 98–110)
Creat: 0.81 mg/dL (ref 0.50–1.03)
Globulin: 3 g/dL (ref 1.9–3.7)
Glucose, Bld: 52 mg/dL — ABNORMAL LOW (ref 65–99)
Potassium: 4.1 mmol/L (ref 3.5–5.3)
Sodium: 142 mmol/L (ref 135–146)
Total Bilirubin: 0.8 mg/dL (ref 0.2–1.2)
Total Protein: 7.5 g/dL (ref 6.1–8.1)
eGFR: 87 mL/min/1.73m2 (ref >=60)

## 2024-07-10 LAB — HEMOGLOBIN A1C
Hgb A1c MFr Bld: 5.6 % (ref <5.7)
Mean Plasma Glucose: 114 mg/dL
eAG (mmol/L): 6.3 mmol/L

## 2024-07-10 LAB — CBC
HCT: 41.3 % (ref 35.9–46.0)
Hemoglobin: 13.7 g/dL (ref 11.7–15.5)
MCH: 32.2 pg (ref 27.0–33.0)
MCHC: 33.2 g/dL (ref 31.6–35.4)
MCV: 97.2 fL (ref 81.4–101.7)
MPV: 10.9 fL (ref 7.5–12.5)
Platelets: 259 Thousand/uL (ref 140–400)
RBC: 4.25 Million/uL (ref 3.80–5.10)
RDW: 11.9 % (ref 11.0–15.0)
WBC: 4.6 Thousand/uL (ref 3.8–10.8)

## 2024-07-10 LAB — LIPID PANEL
Cholesterol: 224 mg/dL — ABNORMAL HIGH (ref <200)
HDL: 61 mg/dL (ref >=50)
LDL Cholesterol (Calc): 138 mg/dL — ABNORMAL HIGH
Non-HDL Cholesterol (Calc): 163 mg/dL — ABNORMAL HIGH (ref <130)
Total CHOL/HDL Ratio: 3.7 (calc) (ref <5.0)
Triglycerides: 126 mg/dL (ref <150)

## 2025-01-08 ENCOUNTER — Ambulatory Visit: Admitting: Internal Medicine
